# Patient Record
Sex: Female | Born: 1935 | Race: White | Hispanic: No | Smoking: Never smoker
Health system: Southern US, Community
[De-identification: ages and names within clinical notes are randomized; demographics above are authoritative.]

## PROBLEM LIST (undated history)

## (undated) DIAGNOSIS — Z5189 Encounter for other specified aftercare: Secondary | ICD-10-CM

## (undated) DIAGNOSIS — M1990 Unspecified osteoarthritis, unspecified site: Secondary | ICD-10-CM

## (undated) DIAGNOSIS — R51 Headache: Secondary | ICD-10-CM

## (undated) DIAGNOSIS — I10 Essential (primary) hypertension: Secondary | ICD-10-CM

## (undated) DIAGNOSIS — R519 Headache, unspecified: Secondary | ICD-10-CM

## (undated) DIAGNOSIS — K5900 Constipation, unspecified: Secondary | ICD-10-CM

## (undated) DIAGNOSIS — K219 Gastro-esophageal reflux disease without esophagitis: Secondary | ICD-10-CM

## (undated) DIAGNOSIS — M810 Age-related osteoporosis without current pathological fracture: Secondary | ICD-10-CM

## (undated) DIAGNOSIS — F419 Anxiety disorder, unspecified: Secondary | ICD-10-CM

## (undated) DIAGNOSIS — F32A Depression, unspecified: Secondary | ICD-10-CM

## (undated) DIAGNOSIS — F329 Major depressive disorder, single episode, unspecified: Secondary | ICD-10-CM

## (undated) HISTORY — PX: CHOLECYSTECTOMY: SHX55

## (undated) HISTORY — PX: ABDOMINAL HYSTERECTOMY: SHX81

## (undated) HISTORY — PX: COLON SURGERY: SHX602

---

## 1999-05-02 ENCOUNTER — Encounter: Admission: RE | Admit: 1999-05-02 | Discharge: 1999-05-02 | Payer: Self-pay | Admitting: *Deleted

## 1999-05-02 ENCOUNTER — Encounter: Payer: Self-pay | Admitting: *Deleted

## 2001-09-28 ENCOUNTER — Inpatient Hospital Stay (HOSPITAL_COMMUNITY): Admission: EM | Admit: 2001-09-28 | Discharge: 2001-09-29 | Payer: Self-pay | Admitting: Internal Medicine

## 2001-09-28 ENCOUNTER — Encounter: Payer: Self-pay | Admitting: Internal Medicine

## 2007-05-29 ENCOUNTER — Inpatient Hospital Stay (HOSPITAL_COMMUNITY): Admission: EM | Admit: 2007-05-29 | Discharge: 2007-06-01 | Payer: Self-pay | Admitting: Emergency Medicine

## 2008-01-20 ENCOUNTER — Encounter: Admission: RE | Admit: 2008-01-20 | Discharge: 2008-01-20 | Payer: Self-pay | Admitting: Specialist

## 2008-02-10 ENCOUNTER — Encounter: Admission: RE | Admit: 2008-02-10 | Discharge: 2008-02-10 | Payer: Self-pay | Admitting: Specialist

## 2010-07-31 ENCOUNTER — Encounter: Payer: Self-pay | Admitting: Specialist

## 2010-11-21 NOTE — H&P (Signed)
NAME:  Carolyn Odonnell, Carolyn Odonnell                 ACCOUNT NO.:  [id number]   MEDICAL RECORD NO.:  [id number]           PATIENT TYPE:   LOCATION:                               FACILITY:  [facility]   PHYSICIAN:  Hollice Espy, M.D.DATE OF BIRTH:  [birth year]   DATE OF ADMISSION:  05/29/2007  DATE OF DISCHARGE:                              HISTORY & PHYSICAL   PRIMARY CARE PHYSICIAN:  Deirdre Peer. Polite, M.D.   CHIEF COMPLAINT:  Syncope.   HISTORY OF PRESENT ILLNESS:  The patient is an 77 year old white female  with a past medical history of GERD, arthritis, and partial colonic  resection secondary to rupture who has been in relatively good health,  although she suffers from constipation.  She has noted no shortness of  breath, no cough, no dysuria, no diarrhea in the last few weeks but then  for the last several days she has been complaining of dizziness when she  stands and she got to the point where she felt so lightheaded today in  the bathroom that she passed out.  She said that she noted no diarrhea  or blood in her stool prior to passing out.  When she came to, she was  brought into the emergency room.  She was noted on admission to have a  heart rate of 51, a blood pressure which was stable around 116/51.  The  rest of her labs were essentially unremarkable.  There blood pressures  are all laying down.  The patient had lab work done including a CT scan  of the head which was unremarkable.  A chest x-ray showed no evidence of  any acute infarct.  However, her lab work noted a white count of 33 with  a 92% shift.  Her MCV was on the high end of normal at 99.  The rest of  her labs were noted for a sodium of 130, BUN of 20, creatinine of 1.2,  and minimally elevated transaminases with an AST of 68, ALT of 40, and  an alk phos of 145.  As mentioned before, chest x-ray and UA were  unremarkable.  The patient had no interventions done in the emergency  room.   REVIEW OF SYSTEMS:  Currently she  states she is feeling okay.  She  denies any headache and no dizziness as long as she lies down.  No  vision changes.  No dysphagia.  No chest pain, palpitations.  No  shortness of breath, wheezing, coughing.  She notes no abdominal pain,  although she again has occasional problems with constipation and some  abdominal fullness.  She last had a bowel movement several days ago but  says it was incomplete.  She notes no diarrhea.  No focal extremity  numbness, weakness, or pain.  Review of systems is otherwise negative.   PAST MEDICAL HISTORY:  1. Arthritis.  2. GERD.  3. Occasional vertigo.  4. Hypertension.   MEDICATIONS:  She is on Aciphex, Norvasc, atenolol, Darvocet, meclizine  p.r.n., meloxicam, Prilosec, Paxil, Premarin, and Ambien.   ALLERGIES:  She has no known drug allergies.  SOCIAL HISTORY:  She denies any tobacco, alcohol, or drug use.   FAMILY HISTORY:  Noncontributory.   PHYSICAL EXAMINATION:  VITAL SIGNS:  On admission temp 97.1; heart rate  51 initially up to 62; blood pressure 116/51 initially up to as high as  140/66; respirations 20, O2 sat 97% on room air.  GENERAL:  She is alert and oriented x3.  No apparent distress.  HEENT:  Normocephalic atraumatic.  Her mucous membranes are slightly  dry.  NECK:  She has no carotid bruits.  HEART:  Regular rate and rhythm.  S1 S2.  LUNGS:  Clear to auscultation bilaterally.  ABDOMEN:  Soft and nontender.  Hyperactive bowel sounds.  EXTREMITIES:  Show no clubbing, cyanosis, or edema.   LABORATORY:  White count is 33, MCV is 99, hemoglobin and hematocrit  13.5 and 40, platelet count 424, 92% neutrophils.  INR is 1.  UA showed  a small amount of bilirubin, otherwise unremarkable.  Sodium 130,  potassium 3.4, chloride 96, bicarb 25, BUN 20, creatinine 1.3, glucose  119.  LFTs are noted for AST 68, ALT 40, alk phos 147.  CT scan is  unremarkable.  Chest x-ray shows some left upper lobe scarring.  EKG  shows a normal sinus  rhythm, low voltage QRS.   ASSESSMENT/PLAN:  1. Syncope.  Likely cause, she may be orthostatic from possible      dehydration from infection.  We will plan to gently hydrate and      recheck labs in the morning and ambulate in the morning.  2. Leukocytosis, markedly elevated.  With this degree of elevation and      the patient is not on any steroids, I suspect that this likely is      an infection.  We have ruled out urine, pulmonary, or C. diff and I      am concerned about the possibility with her chronic constipation a      possible bacterial __________ of the gut from stool.  Cipro and      Flagyl and check an abdominal x-ray to confirm.  Recheck CBC      tomorrow.  3. Acute renal insufficiency likely secondary to number one.  We will      plan to hydrate and recheck labs.  4. Gastroesophageal reflux disease.  Continue Protonix.      Hollice Espy, M.D.  Electronically Signed     SKK/MEDQ  D:  05/29/2007  T:  05/29/2007  Job:  [id number]   cc:   Deirdre Peer. Polite, M.D.

## 2010-11-21 NOTE — Discharge Summary (Signed)
Carolyn Odonnell, Carolyn Odonnell                 ACCOUNT NO.:  [id number]   MEDICAL RECORD NO.:  [id number]          PATIENT TYPE:  INP   LOCATION:  4730                         FACILITY:  [facility]   PHYSICIAN:  Georgann Housekeeper, MD      DATE OF BIRTH:  1919/11/01   DATE OF ADMISSION:  05/29/2007  DATE OF DISCHARGE:  06/01/2007                               DISCHARGE SUMMARY   DISCHARGE DIAGNOSES:  1. Syncope, probably related to vasovagal or micturition.  No evidence      of any bradycardia during the monitor on hospitalization.  Blood      pressure remained stable.  2. Elevated white count, mild dehydration.  No evidence of infection.      Blood culture and urine culture remained negative.  3. Constipation.  4. Hypertension.   MEDICATIONS ON DISCHARGE:  1. Fiorinal 50/325 two capsules daily.  2. Premarin 0.9 mg daily.  3. Lotrel 10/20 one a day.  4. Atenolol 5 mg daily.  5. Paxil 10 mg daily.  6. Prilosec 20 mg daily.  7. Ambien 5 mg q.h.s.  8. Mobic 7.5 mg daily.  9. Darvocet 2 tablets as needed.  10.Vitamin D 2000 units daily.  11.Multivitamin 1 a day.  12.MiraLax 17 grams daily.  13.Colace 100 mg b.i.d.   Follow with Dr. Nehemiah Settle in 2 weeks.   LABORATORY DATA:  White count 8.6, hemoglobin 11.2.  Sodium 136,  potassium 3.7, creatinine 0.8.  LFTs normal.  Blood culture, urine  culture is negative.  Chest x-ray negative.  Head CT was negative.   HOSPITAL COURSE:  An 77 year old with above medical conditions who  presented with a presyncopal episode while she was in the bathroom.  In  the emergency room, she was found to have an elevated white count of  32,000.  She was started on antibiotic Flagyl and Cipro empirically.  Blood culture and urine culture all remained negative.  Her white count  the next day came back to be normal.  Since the cultures were negative,  the antibiotics were stopped.  She was mildly dehydrated, was started on  IV fluids.  Creatinine was 1.29 and came down to 0.8.   Also, she had  mildly elevated LFTs which also resolved.  KUB showed some constipation.  The patient had had long problems with constipation.  No evidence of any  acute abdomen or obstruction.  The patient was started MiraLax and stool  softener.  She required some Fleets enema and Dulcolax.  She will  maintain the bowel regimen and follow outpatient, and adjust it  appropriately.   As far as hypertension, blood pressure remained stable.  Her heart rate  remained in the 60s and 70s on monitor.  No evidence of any symptomatic  bradycardia.   As far as her follow up, in 2 weeks with Dr. Nehemiah Settle at the office.      Georgann Housekeeper, MD  Electronically Signed     KH/MEDQ  D:  06/01/2007  T:  06/01/2007  Job:  [id number]

## 2010-11-24 NOTE — H&P (Signed)
[place]  Patient:    Carolyn Odonnell, Carolyn Odonnell Visit Number: [id number] MRN: [id number]          Service Type: MED Location: [id number] Attending Physician:  Kae Heller Dictated by:   _________________ Admit Date:  09/28/2001 Discharge Date: 09/29/2001                           History and Physical  DATE OF BIRTH:  04-05-1920  CHIEF COMPLAINT:  Lower abdominal pain, nausea, vomiting and now diarrhea.  HISTORY OF PRESENT ILLNESS:  This is an 77 year old female with a history of hypertension, GERD, recent diagnosis of iron deficiency anemia and distant history of diverticulitis with partial colectomy, who was seen at the [facility] this morning with nausea, vomiting and diarrhea.  Patient complains of developing bilateral lower quadrant pain last evening around 11 p.m., followed by multiple episodes of nausea and vomiting. No hematemesis.  Phenergan and hyoscyamine were eventually called in by Dr. Lester Kinsman, who was on call, and those medications helped her briefly, but her symptoms recurred later in the early morning hours and she presented to the walk-in clinic around 11 a.m.  Patient states she was at that time keeping some clear liquids down.  Patient also states that she was feeling quite bloated and her abdomen seemed quite distended this morning. She felt like she needed to have a bowel movement but could not.  Abdominal films did not support small-bowel obstruction at walk-in clinic but an ileus. White blood cell count was 18.1 with 82% neutrophils.  Hemoglobin measures 9.6, platelets 553,000, MCV 74.6.  Urinalysis was normal.  Patient was given a Phenergan suppository and subsequently developed loose stools now x5.  No melena.  No hematochezia.  Patient states she feels much better since she has had multiple stools.  She has not had a fever that she is aware of.  Patient does have a  history in 1991 of developing a perforated diverticula apparently with abscess formation and underwent a partial colectomy with removal of around 13 cm of colon.  Patient states that one week ago she was seen by Dr. Barbette Hair. Vaughan Basta. for a sense of fatigue and her stool was noted to be heme-positive on rectal exam and a hemoglobin returned at 9.0.  She was started on iron and referred to Dr. Genene Churn. Sherin Quarry for evaluation; that evaluation is pending.  States her last -- what sounds like a -- flexible sigmoidoscopy was around two years ago with Dr. Hedwig Morton. Juanda Chance and apparently was normal.  Patient denies any history of peptic ulcer disease, just GERD for which she takes Prilosec regularly.  She denies nonsteroidal anti-inflammatory drug use, although she does use Fiorinal intermittently.  PAST MEDICAL HISTORY: 1. Hypertension. 2. GERD. 3. Question of depression. 4. Neck pain/headache, chronic. 5. Arthritis.  PAST SURGICAL HISTORY: 1. Partial colectomy as noted previously with diverticulitis, 1991. 2. Status post appendectomy. 3. Status post cholecystectomy, 1970. 4. Status post hysterectomy. 5. Status post bilateral salpingo-oophorectomy.  MEDICATIONS: 1. Norvasc, unknown dose, suspect 5 mg daily. 2. Atenolol, unknown dose, suspect 50 mg one-half tab p.o. q.d. 3. Hydrochlorothiazide, unknown dose, suspect 50 mg one-half tab p.o. q.d. 4. Prilosec, unknown dose, suspect 20 mg p.o. q.d. 5. Paxil, unknown dose, suspect 10 mg p.o. q.d. 6. Ambien -- suspect 10 mg a half tab p.o. q.h.s. 7. Fiorinal p.r.n. neck pain and  headache.  ALLERGIES:  PENICILLIN causes swelling; SULFA -- unknown reaction; TETRACYCLINE -- visual changes; NOVOCAIN -- palpitations; DEMEROL -- unknown reaction; PERCOCET WITH CODEINE -- hot flushing; IVP DYE -- unknown reaction.  SOCIAL HISTORY:  Tobacco:  No use.  Alcohol:  No use.  Caffeine:  Only in her Fiorinal.  FAMILY HISTORY:  Father died at age  56, heart disease and diverticulitis. Mother died at age 88, diabetes and heart disease.  Sister, age 59, with heart disease and diabetes.  Two brothers, one died at age 73 of an MI and another died at age 31 of unknown type of lymphoma.  Son, age 27, is healthy.  SOCIAL HISTORY:  Patient lives with her husband of 56 years.  REVIEW OF SYSTEMS:  Has had recent sores on the roof of her mouth, treated with what sounds like Magik Mouthwash.  PULMONARY:  Denies shortness of breath, cough, respiratory symptoms recently.  CARDIOVASCULAR:  Other than hypertension, denies heart disease.  PHYSICAL EXAMINATION:  VITAL SIGNS:  Temperature 98.3, heart rate 80 and regular, respiratory rate 20, blood pressure 154/78.  Weight 139.  HEENT:  Patient with a benign-appearing overgrowth of the roof of her mouth with central superficial irritation.  Mucous membranes are moist.  Throat without injection.  Tympanic membranes are clear.  Pupils are equal, round and reactive to light.  Extraocular movements intact.  NECK:  Supple without adenopathy.  No thyromegaly.  CHEST:  Clear.  CARDIOVASCULAR:  Regular rate and rhythm with normal S1 and S2.  No S3 or S4. She does have a grade 2/6 systolic murmur at left sternal border, does not radiate.  No carotid bruits.  ______ femoral, DP and PT pulses normodynamic and equal.  ABDOMEN:  Soft.  Bowel sounds present throughout.  Mild bilateral lower quadrant tenderness.  No rebound.  No peritoneal signs.  EXTREMITIES:  Without edema.  RECTAL:  Deferred as she had a recent heme-positive stool and would not necessarily expect that to be changed.  ASSESSMENT AND PLAN: 1. Abdominal pain with nausea and vomiting and now diarrhea.  It seems    improved at this point, however, with her elevated white count, we will    check a CT scan of the abdomen and pelvis; hopefully, we can get that    tonight.  If note, I will go ahead and start intravenous antibiotics.  If     it is negative, we will just supply her with intravenous fluids and Tylenol     as needed.  We will allow her to have water p.o., Phenergan intravenously    p.r.n., to hold her other p.o. medications for now. 2. Hypertension, fair control at this point.  We will hold her medications for    now. 3. Gastroesophageal reflux disease.  Protonix intravenously. Dictated by:   _________________ Attending Physician:  Kae Heller DD:  09/28/01 TD:  09/29/01 Job: [id number]

## 2011-04-17 LAB — COMPREHENSIVE METABOLIC PANEL
ALT: 38 — ABNORMAL HIGH
ALT: 62 — ABNORMAL HIGH
Albumin: 3.2 — ABNORMAL LOW
Albumin: 3.9
Alkaline Phosphatase: 147 — ABNORMAL HIGH
Alkaline Phosphatase: 97
BUN: 11
BUN: 20
BUN: 8
CO2: 25
CO2: 25
CO2: 25
Calcium: 9.6
Chloride: 105
Chloride: 96
Creatinine, Ser: 0.87
Creatinine, Ser: 1.29 — ABNORMAL HIGH
GFR calc non Af Amer: >60
GFR calc non Af Amer: >60
Glucose, Bld: 119 — ABNORMAL HIGH
Glucose, Bld: 80
Potassium: 3.7
Total Bilirubin: 0.7
Total Protein: 5.5 — ABNORMAL LOW
Total Protein: 6.7

## 2011-04-17 LAB — PROTIME-INR: INR: 1

## 2011-04-17 LAB — CBC
HCT: 32.2 — ABNORMAL LOW
HCT: 34.7 — ABNORMAL LOW
HCT: 39.5
Hemoglobin: 11.2 — ABNORMAL LOW
Hemoglobin: 12
Hemoglobin: 13.5
MCHC: 34.6
MCV: 98.9
MCV: 99.1
MCV: 99.1
Platelets: 320
RBC: 3.26 — ABNORMAL LOW
RBC: 3.5 — ABNORMAL LOW
RBC: 3.99
RDW: 14.8
WBC: 32.9 — ABNORMAL HIGH
WBC: 8.6

## 2011-04-17 LAB — URINE CULTURE: Culture: NO GROWTH

## 2011-04-17 LAB — DIFFERENTIAL
Basophils Relative: 0
Eosinophils Absolute: 0.1 — ABNORMAL LOW
Lymphs Abs: 1.2
Monocytes Absolute: 1.3 — ABNORMAL HIGH
Monocytes Relative: 4

## 2011-04-17 LAB — URINALYSIS, ROUTINE W REFLEX MICROSCOPIC
Glucose, UA: NEGATIVE
Ketones, ur: NEGATIVE
Nitrite: NEGATIVE
Protein, ur: NEGATIVE

## 2011-04-17 LAB — APTT: aPTT: 25

## 2011-04-17 LAB — SAMPLE TO BLOOD BANK

## 2011-12-23 ENCOUNTER — Emergency Department (HOSPITAL_COMMUNITY): Payer: Medicare Other

## 2011-12-23 ENCOUNTER — Inpatient Hospital Stay (HOSPITAL_COMMUNITY)
Admission: EM | Admit: 2011-12-23 | Discharge: 2011-12-27 | DRG: 392 | Disposition: A | Discharge status: Nursing Home (Includes ICF) | Payer: Medicare Other | Source: Ambulatory Visit | Attending: Internal Medicine | Admitting: Internal Medicine

## 2011-12-23 ENCOUNTER — Encounter (HOSPITAL_COMMUNITY): Payer: Self-pay

## 2011-12-23 DIAGNOSIS — M129 Arthropathy, unspecified: Secondary | ICD-10-CM | POA: Diagnosis present

## 2011-12-23 DIAGNOSIS — F3289 Other specified depressive episodes: Secondary | ICD-10-CM | POA: Diagnosis present

## 2011-12-23 DIAGNOSIS — D7289 Other specified disorders of white blood cells: Secondary | ICD-10-CM

## 2011-12-23 DIAGNOSIS — R55 Syncope and collapse: Secondary | ICD-10-CM | POA: Diagnosis present

## 2011-12-23 DIAGNOSIS — M8448XA Pathological fracture, other site, initial encounter for fracture: Secondary | ICD-10-CM | POA: Diagnosis present

## 2011-12-23 DIAGNOSIS — Z23 Encounter for immunization: Secondary | ICD-10-CM

## 2011-12-23 DIAGNOSIS — G43909 Migraine, unspecified, not intractable, without status migrainosus: Secondary | ICD-10-CM | POA: Diagnosis present

## 2011-12-23 DIAGNOSIS — F411 Generalized anxiety disorder: Secondary | ICD-10-CM | POA: Diagnosis present

## 2011-12-23 DIAGNOSIS — D72829 Elevated white blood cell count, unspecified: Secondary | ICD-10-CM | POA: Diagnosis present

## 2011-12-23 DIAGNOSIS — S22000A Wedge compression fracture of unspecified thoracic vertebra, initial encounter for closed fracture: Secondary | ICD-10-CM

## 2011-12-23 DIAGNOSIS — I10 Essential (primary) hypertension: Secondary | ICD-10-CM

## 2011-12-23 DIAGNOSIS — S22009A Unspecified fracture of unspecified thoracic vertebra, initial encounter for closed fracture: Secondary | ICD-10-CM

## 2011-12-23 DIAGNOSIS — F329 Major depressive disorder, single episode, unspecified: Secondary | ICD-10-CM | POA: Diagnosis present

## 2011-12-23 DIAGNOSIS — K5289 Other specified noninfective gastroenteritis and colitis: Principal | ICD-10-CM | POA: Diagnosis present

## 2011-12-23 DIAGNOSIS — G4700 Insomnia, unspecified: Secondary | ICD-10-CM | POA: Diagnosis present

## 2011-12-23 HISTORY — DX: Anxiety disorder, unspecified: F41.9

## 2011-12-23 HISTORY — DX: Essential (primary) hypertension: I10

## 2011-12-23 HISTORY — DX: Unspecified osteoarthritis, unspecified site: M19.90

## 2011-12-23 HISTORY — DX: Depression, unspecified: F32.A

## 2011-12-23 HISTORY — DX: Major depressive disorder, single episode, unspecified: F32.9

## 2011-12-23 LAB — CBC
Hemoglobin: 12.8 g/dL (ref 12.0–15.0)
MCH: 33.3 pg (ref 26.0–34.0)
Platelets: 206 10*3/uL (ref 150–400)
RBC: 3.84 MIL/uL — ABNORMAL LOW (ref 3.87–5.11)
WBC: 20.2 10*3/uL — ABNORMAL HIGH (ref 4.0–10.5)

## 2011-12-23 LAB — COMPREHENSIVE METABOLIC PANEL
ALT: 13 U/L (ref 0–35)
AST: 28 U/L (ref 0–37)
Calcium: 10.3 mg/dL (ref 8.4–10.5)
Creatinine, Ser: 0.89 mg/dL (ref 0.50–1.10)
GFR calc Af Amer: 64 mL/min — ABNORMAL LOW (ref >90)
Sodium: 132 mEq/L — ABNORMAL LOW (ref 135–145)
Total Protein: 6.7 g/dL (ref 6.0–8.3)

## 2011-12-23 LAB — DIFFERENTIAL
Eosinophils Absolute: 0 10*3/uL (ref 0.0–0.7)
Lymphocytes Relative: 6 % — ABNORMAL LOW (ref 12–46)
Lymphs Abs: 1.1 10*3/uL (ref 0.7–4.0)
Monocytes Relative: 8 % (ref 3–12)
Neutrophils Relative %: 86 % — ABNORMAL HIGH (ref 43–77)

## 2011-12-23 LAB — TROPONIN I: Troponin I: <0.3 ng/mL (ref <0.30)

## 2011-12-23 LAB — URINALYSIS, ROUTINE W REFLEX MICROSCOPIC
Hgb urine dipstick: NEGATIVE
Nitrite: NEGATIVE
Specific Gravity, Urine: 1.014 (ref 1.005–1.030)
Urobilinogen, UA: 1 mg/dL (ref 0.0–1.0)
pH: 5 (ref 5.0–8.0)

## 2011-12-23 LAB — LACTIC ACID, PLASMA: Lactic Acid, Venous: 1.9 mmol/L (ref 0.5–2.2)

## 2011-12-23 MED ORDER — ACETAMINOPHEN 325 MG PO TABS
650.0000 mg | ORAL_TABLET | Freq: Four times a day (QID) | ORAL | Status: DC | PRN
  Administered 2011-12-25 – 2011-12-26 (×2): 650 mg via ORAL
  Filled 2011-12-23: qty 1
  Filled 2011-12-23: qty 2

## 2011-12-23 MED ORDER — AMLODIPINE BESY-BENAZEPRIL HCL 10-40 MG PO CAPS: 1.0000 | ORAL_CAPSULE | Freq: Every morning | ORAL | Status: DC

## 2011-12-23 MED ORDER — ONDANSETRON HCL 4 MG PO TABS: 4.0000 mg | ORAL_TABLET | Freq: Four times a day (QID) | ORAL | Status: DC | PRN

## 2011-12-23 MED ORDER — BENAZEPRIL HCL 40 MG PO TABS
40.0000 mg | ORAL_TABLET | Freq: Every day | ORAL | Status: DC
  Administered 2011-12-24 – 2011-12-27 (×4): 40 mg via ORAL
  Filled 2011-12-23 (×4): qty 1

## 2011-12-23 MED ORDER — OXYBUTYNIN CHLORIDE ER 5 MG PO TB24
5.0000 mg | ORAL_TABLET | Freq: Every morning | ORAL | Status: DC
Start: 2011-12-24 — End: 2011-12-27
  Administered 2011-12-24 – 2011-12-27 (×4): 5 mg via ORAL
  Filled 2011-12-23 (×4): qty 1

## 2011-12-23 MED ORDER — SODIUM CHLORIDE 0.9 % IV SOLN
Freq: Once | INTRAVENOUS | Status: AC
  Administered 2011-12-23: 17:00:00 via INTRAVENOUS

## 2011-12-23 MED ORDER — PAROXETINE HCL 10 MG PO TABS
10.0000 mg | ORAL_TABLET | Freq: Every morning | ORAL | Status: DC
  Administered 2011-12-24 – 2011-12-27 (×4): 10 mg via ORAL
  Filled 2011-12-23 (×4): qty 1

## 2011-12-23 MED ORDER — SODIUM CHLORIDE 0.9 % IV SOLN
INTRAVENOUS | Status: AC
  Administered 2011-12-23: 75 mL/h via INTRAVENOUS

## 2011-12-23 MED ORDER — PANTOPRAZOLE SODIUM 40 MG PO TBEC
40.0000 mg | DELAYED_RELEASE_TABLET | Freq: Every day | ORAL | Status: DC
  Administered 2011-12-24 – 2011-12-27 (×4): 40 mg via ORAL
  Filled 2011-12-23: qty 1
  Filled 2011-12-23: qty 2
  Filled 2011-12-23 (×2): qty 1

## 2011-12-23 MED ORDER — AMLODIPINE BESYLATE 10 MG PO TABS
10.0000 mg | ORAL_TABLET | Freq: Every day | ORAL | Status: DC
  Administered 2011-12-24 – 2011-12-27 (×4): 10 mg via ORAL
  Filled 2011-12-23 (×4): qty 1

## 2011-12-23 MED ORDER — SODIUM CHLORIDE 0.9 % IJ SOLN
3.0000 mL | Freq: Two times a day (BID) | INTRAMUSCULAR | Status: DC
  Administered 2011-12-26: 3 mL via INTRAVENOUS

## 2011-12-23 MED ORDER — CIPROFLOXACIN IN D5W 200 MG/100ML IV SOLN
200.0000 mg | INTRAVENOUS | Status: DC
Start: 2011-12-24 — End: 2011-12-26
  Administered 2011-12-24 – 2011-12-26 (×3): 200 mg via INTRAVENOUS
  Filled 2011-12-23 (×3): qty 100

## 2011-12-23 MED ORDER — FENTANYL CITRATE 0.05 MG/ML IJ SOLN
12.5000 ug | Freq: Once | INTRAMUSCULAR | Status: AC
  Administered 2011-12-23: 12.5 ug via INTRAVENOUS
  Filled 2011-12-23: qty 2

## 2011-12-23 MED ORDER — ONDANSETRON HCL 4 MG/2ML IJ SOLN: 4.0000 mg | Freq: Four times a day (QID) | INTRAMUSCULAR | Status: DC | PRN

## 2011-12-23 MED ORDER — ENSURE PLUS PO LIQD
237.0000 mL | Freq: Three times a day (TID) | ORAL | Status: DC
  Administered 2011-12-24 (×2): 237 mL via ORAL
  Filled 2011-12-23 (×6): qty 237

## 2011-12-23 MED ORDER — SODIUM CHLORIDE 0.9 % IV SOLN
1000.0000 mL | INTRAVENOUS | Status: DC
  Administered 2011-12-23: 1000 mL via INTRAVENOUS

## 2011-12-23 MED ORDER — LIDOCAINE 5 % EX PTCH
1.0000 | MEDICATED_PATCH | CUTANEOUS | Status: DC
Start: 2011-12-24 — End: 2011-12-27
  Administered 2011-12-24 – 2011-12-27 (×4): 1 via TRANSDERMAL
  Filled 2011-12-23 (×5): qty 1

## 2011-12-23 MED ORDER — ONDANSETRON HCL 4 MG/2ML IJ SOLN
INTRAMUSCULAR | Status: AC
  Administered 2011-12-23: 13:00:00
  Filled 2011-12-23: qty 2

## 2011-12-23 MED ORDER — SODIUM CHLORIDE 0.9 % IV SOLN
INTRAVENOUS | Status: AC
  Administered 2011-12-24 (×2): via INTRAVENOUS

## 2011-12-23 MED ORDER — ACETAMINOPHEN 650 MG RE SUPP: 650.0000 mg | Freq: Four times a day (QID) | RECTAL | Status: DC | PRN

## 2011-12-23 NOTE — ED Notes (Signed)
Pt resting quietly at the time. Denies pain. She is alert and oriented x4. Updated on plan of care. Family at bedside. Vital signs stable.

## 2011-12-23 NOTE — ED Notes (Signed)
Pt returned to exam room from CT scan. Vital signs stable. Placed back on cardiac monitor. Denies pain at present. She is alert and oriented x4.

## 2011-12-23 NOTE — ED Notes (Signed)
Admitting MD at bedside.

## 2011-12-23 NOTE — ED Notes (Signed)
Per EMS, pt. C/o N/V/D since 10 AM. Reports that she got up to use the restroom and had a syncopal episode. Denies injuries or pain. Initial BP 110/44 and HR 48 supine, BP 84/40 and HR 60 in fowlers. Given 500 ml NS bolus and 4mg  zofran.

## 2011-12-23 NOTE — ED Notes (Signed)
Pt. States that she was on her way to the bathroom when she "got weak and fell".  States that she didn't hit anything when she fell, denies pain. Reports not feeling well for the past couple days.

## 2011-12-23 NOTE — ED Notes (Addendum)
Pt transported to CT ?

## 2011-12-23 NOTE — ED Notes (Signed)
PT has questions about medication. PT requesting a sleeping aid

## 2011-12-23 NOTE — H&P (Signed)
Carolyn Odonnell is an 78 y.o. female.   PCP - Dr.Polite. Chief Complaint: Loss of consciousness. HPI: 78 year-old female with history of hypertension, arthritis who lives in assisted living facility had an episode where she lost consciousness after she had multiple episodes of nausea and vomiting. Patient states that she lost consciousness for a few minutes. She threw multiple times before that and in the ER patient also is complaining of some right lower quadrant pain. Patient also is complaining of upper back pain. She did not have a tongue bites or incontinence of urine. And was nonfocal. Patient's son states that she has been having dysuria for some time now. Denies any diarrhea. Patient did not have any palpitations chest pain or shortness of breath. In the ER patient had a CT head which did not show anything acute. X-ray of the T-spine done because of the pain in the upper back shows fractures. And also her blood work shows significant leukocytosis. By the time I examined the patient patient denies any abdominal pain but did receive pain relief medication prior to my exam. Patient's son states patient's PCP at this time has been trying to decrease her Ambien and Fioricet medication due to her getting increasingly drowsy. EKG shows sinus bradycardia at rate around 54 per minute. Patient has been admitted for further observation and management.  Past Medical History  Diagnosis Date  . Hypertension   . Arthritis   . Depression   . Anxiety     Past Surgical History  Procedure Date  . Colon surgery   . Abdominal hysterectomy   . Cholecystectomy     History reviewed. No pertinent family history. Social History:  reports that she has never smoked. She does not have any smokeless tobacco history on file. She reports that she does not drink alcohol or use illicit drugs.  Allergies:  Allergies  Allergen Reactions  . Codeine   . Doxycycline   . Fosamax (Alendronate Sodium)   . Morphine And  Related   . Novocain (Procaine Hcl)   . Penicillins   . Percocet (Oxycodone-Acetaminophen)   . Skelaxin (Metaxalone)   . Sulfa Antibiotics Rash     (Not in a hospital admission)  Results for orders placed during the hospital encounter of 12/23/11 (from the past 48 hour(s))  COMPREHENSIVE METABOLIC PANEL     Status: Abnormal   Collection Time   12/23/11  3:34 PM      Component Value Range Comment   Sodium 132 (*) 135 - 145 mEq/L    Potassium 4.0  3.5 - 5.1 mEq/L    Chloride 98  96 - 112 mEq/L    CO2 22  19 - 32 mEq/L    Glucose, Bld 152 (*) 70 - 99 mg/dL    BUN 18  6 - 23 mg/dL    Creatinine, Ser 1.61  0.50 - 1.10 mg/dL    Calcium 09.6  8.4 - 10.5 mg/dL    Total Protein 6.7  6.0 - 8.3 g/dL    Albumin 3.5  3.5 - 5.2 g/dL    AST 28  0 - 37 U/L    ALT 13  0 - 35 U/L    Alkaline Phosphatase 249 (*) 39 - 117 U/L    Total Bilirubin 0.5  0.3 - 1.2 mg/dL    GFR calc non Af Amer 55 (*) >90 mL/min    GFR calc Af Amer 64 (*) >90 mL/min   CBC     Status: Abnormal  Collection Time   12/23/11  3:34 PM      Component Value Range Comment   WBC 20.2 (*) 4.0 - 10.5 K/uL    RBC 3.84 (*) 3.87 - 5.11 MIL/uL    Hemoglobin 12.8  12.0 - 15.0 g/dL    HCT 56.2  13.0 - 86.5 %    MCV 98.2  78.0 - 100.0 fL    MCH 33.3  26.0 - 34.0 pg    MCHC 34.0  30.0 - 36.0 g/dL    RDW 78.4 (*) 69.6 - 15.5 %    Platelets 206  150 - 400 K/uL   DIFFERENTIAL     Status: Abnormal   Collection Time   12/23/11  3:34 PM      Component Value Range Comment   Neutrophils Relative 86 (*) 43 - 77 %    Neutro Abs 17.4 (*) 1.7 - 7.7 K/uL    Lymphocytes Relative 6 (*) 12 - 46 %    Lymphs Abs 1.1  0.7 - 4.0 K/uL    Monocytes Relative 8  3 - 12 %    Monocytes Absolute 1.6 (*) 0.1 - 1.0 K/uL    Eosinophils Relative 0  0 - 5 %    Eosinophils Absolute 0.0  0.0 - 0.7 K/uL    Basophils Relative 0  0 - 1 %    Basophils Absolute 0.0  0.0 - 0.1 K/uL   LACTIC ACID, PLASMA     Status: Normal   Collection Time   12/23/11  3:34 PM        Component Value Range Comment   Lactic Acid, Venous 1.9  0.5 - 2.2 mmol/L   TROPONIN I     Status: Normal   Collection Time   12/23/11  6:08 PM      Component Value Range Comment   Troponin I <0.30  <0.30 ng/mL   URINALYSIS, ROUTINE W REFLEX MICROSCOPIC     Status: Abnormal   Collection Time   12/23/11  8:28 PM      Component Value Range Comment   Color, Urine AMBER (*) YELLOW BIOCHEMICALS MAY BE AFFECTED BY COLOR   APPearance CLOUDY (*) CLEAR    Specific Gravity, Urine 1.014  1.005 - 1.030    pH 5.0  5.0 - 8.0    Glucose, UA NEGATIVE  NEGATIVE mg/dL    Hgb urine dipstick NEGATIVE  NEGATIVE    Bilirubin Urine SMALL (*) NEGATIVE    Ketones, ur NEGATIVE  NEGATIVE mg/dL    Protein, ur NEGATIVE  NEGATIVE mg/dL    Urobilinogen, UA 1.0  0.0 - 1.0 mg/dL    Nitrite NEGATIVE  NEGATIVE    Leukocytes, UA NEGATIVE  NEGATIVE MICROSCOPIC NOT DONE ON URINES WITH NEGATIVE PROTEIN, BLOOD, LEUKOCYTES, NITRITE, OR GLUCOSE <1000 mg/dL.   Dg Chest 2 View  12/23/2011  *RADIOLOGY REPORT*  Clinical Data: Cough.  There is no pain.  Leukocytosis.  CHEST - 2 VIEW  Comparison: 08/02/2011  Findings: Left upper and lower lobe scarring remains stable.  No evidence of acute infiltrate or edema.  No evidence of pleural effusion.  Heart size is stable and within normal limits.  No mass or lymphadenopathy identified.  IMPRESSION: Stable left lung scarring.  No active disease.  Original Report Authenticated By: Danae Orleans, M.D.   Dg Thoracic Spine W/swimmers  12/23/2011  *RADIOLOGY REPORT*  Clinical Data: Fall.  Upper back pain.  THORACIC SPINE - 2 VIEW + SWIMMERS  Comparison: 08/02/2011 chest radiograph.  Findings: There is superior endplate concave deformity of what appears been a T9 vertebra, compatible with a compression fracture. This is new compared to prior exam.  Additionally, there is at T12 superior endplate compression fracture.  There may be retropulsion at T12. Loss of height is about 50%.  Loss of  height at T9 is less than 25%.  There is a levoconvex curvature of the lumbar spine with the apex at L1-L2.  Mild dextroconvex curvature of the thoracic spine. Swimmer's view demonstrates intact cervicothoracic junction. Severe cervical spondylosis is present from C4-C5 through C7-T1.  IMPRESSION: Interval development of T9 and T12 compression fractures.  MRI is the preferred modality determine the age of the fractures and evaluate the central canal.  Timing of the MRI is dependent on neurologic status.  Original Report Authenticated By: Andreas Newport, M.D.   Ct Head Wo Contrast  12/23/2011  *RADIOLOGY REPORT*  Clinical Data: Became weak and fell.  Hypertension.  CT HEAD WITHOUT CONTRAST  Technique:  Contiguous axial images were obtained from the base of the skull through the vertex without contrast.  Comparison: 05/29/2007.  Findings: No skull fracture.  Nonspecific right frontal 1.8 cm bone lesion is stable.  No intracranial hemorrhage.  Global atrophy without hydrocephalus.  Small vessel disease type changes without CT evidence of large acute infarct.  No intracranial mass lesion detected on this unenhanced exam.  Prominent vascular calcifications.  IMPRESSION: No skull fracture or intracranial hemorrhage.  Small vessel disease type changes without CT evidence of large acute infarct.  Please see above.  Original Report Authenticated By: Fuller Canada, M.D.    Review of Systems  Constitutional: Negative.   HENT: Negative.   Eyes: Negative.   Respiratory: Negative.   Cardiovascular: Negative.   Gastrointestinal: Positive for nausea and vomiting.  Genitourinary: Negative.   Musculoskeletal: Negative.   Skin: Negative.   Neurological: Positive for loss of consciousness.  Endo/Heme/Allergies: Negative.   Psychiatric/Behavioral: Negative.     Blood pressure 122/52, pulse 58, temperature 98.4 F (36.9 C), temperature source Oral, resp. rate 16, SpO2 95.00%. Physical Exam  Constitutional: She  is oriented to person, place, and time. She appears well-developed and well-nourished. No distress.  HENT:  Head: Normocephalic and atraumatic.  Right Ear: External ear normal.  Left Ear: External ear normal.  Nose: Nose normal.  Mouth/Throat: Oropharynx is clear and moist. No oropharyngeal exudate.  Eyes: Conjunctivae are normal. Pupils are equal, round, and reactive to light. Right eye exhibits no discharge. Left eye exhibits no discharge. No scleral icterus.  Neck: Normal range of motion. Neck supple.  Cardiovascular: Normal rate and regular rhythm.   Respiratory: Effort normal and breath sounds normal. No respiratory distress. She has no wheezes. She has no rales.  GI: Soft. Bowel sounds are normal. She exhibits no distension. There is no tenderness. There is no rebound.  Musculoskeletal: Normal range of motion. She exhibits no edema and no tenderness.  Neurological: She is alert and oriented to person, place, and time.       Moves all extremities.  Skin: Skin is warm and dry. She is not diaphoretic.  Psychiatric: Her behavior is normal.     Assessment/Plan #1. Syncope most likely vasovagal - we'll monitor patient on telemetry. As patient has sinus bradycardia we'll hold off Tenormin. Gently hydrate and get PT consult. #2. Leukocytosis - reviewing her chart patient did have a leukocytosis previous admission couple of years ago. But this time it is significantly high. We will get blood cultures  and urine cultures. As patient initially was complaining of abdominal pain in the right lower quadrant and also had multiple episodes of nausea and vomiting we'll get a CT abdomen pelvis. At this time I am placing patient on ciprofloxacin as patient's son was saying that she was complaining of dysuria recently. Follow cultures and CBC with differential closely. #3. T-spine fracture - I have ordered MRI of the T-spine to further study of thoracic spine fractures. Based on this study we'll further  plan. #4. History of hypertension - we will continue Lotrel but hold off Tenormin.  CODE STATUS - full code. Patient is admitted under Dr. Idelle Crouch service.  Eduard Clos. 12/23/2011, 11:28 PM

## 2011-12-23 NOTE — Progress Notes (Signed)
78yo female admitted with syncope and leukocytosis, had been c/o dysuria and RLQ abdominal pain, to begin Cipro for presumed UTI.  Will start Cipro 200mg  IV Q24H for CrCl ~23ml/min.  Vernard Gambles, PharmD, BCPS 12/23/2011 11:54 PM

## 2011-12-23 NOTE — ED Provider Notes (Addendum)
History     CSN: [id number]  Arrival date & time 12/23/11  1314   First MD Initiated Contact with Patient 12/23/11 1501      Chief Complaint  Patient presents with  . Near Syncope    (Consider location/radiation/quality/duration/timing/severity/associated sxs/prior treatment) The history is provided by the nursing home.    78 year old female past medical history of hypertension, arthritis, depression, anxiety, and a syncopal episode 2 years ago presenting with a syncopal episode. Patient has been constipated for 2.5 days, she had a Brown's of emesis this morning nonbilious nonbloody. She had some mild nausea the nausea resolved. She went toward the bathroom for bowel movement. She found herself on the floor. There was no prodrome. She does not know how she ended up on the floor. There were nurses around, and they report no head trauma. However she reports some blood on the back of her head.  Apparently there was loss of consciousness.  She has had a bowel movement since that time here in the emergency department. Right now she is symptom-free with no pain, no weakness, no numbness. She denies any confusion or headache. She denies all prodrome, including warmth, palpitations, diaphoresis. Vital signs are stable on arrival.   Past Medical History  Diagnosis Date  . Hypertension   . Arthritis   . Depression   . Anxiety     Past Surgical History  Procedure Date  . Colon surgery   . Abdominal hysterectomy   . Cholecystectomy     History reviewed. No pertinent family history.  History  Substance Use Topics  . Smoking status: Never Smoker   . Smokeless tobacco: Not on file  . Alcohol Use: No    OB History    Grav Para Term Preterm Abortions TAB SAB Ect Mult Living                  Review of Systems Constitutional: Negative for fever and chills.  HENT: Negative for ear pain, sore throat and trouble swallowing.   Eyes: Negative for pain and visual disturbance.    Respiratory: Negative for cough and shortness of breath.   Cardiovascular: Negative for chest pain and leg swelling.  Gastrointestinal: Negative for nausea, POS vomiting, abdominal pain and diarrhea.  Genitourinary: Negative for dysuria, urgency and frequency.  Musculoskeletal: Negative for back pain and joint swelling.  Skin: Negative for rash and wound.  Neurological: Negative for dizziness, POS syncope, neg speech difficulty, POS generalized weakness and numbness.   Allergies  Codeine; Doxycycline; Fosamax; Morphine and related; Novocain; Penicillins; Percocet; Skelaxin; and Sulfa antibiotics  Home Medications   No current outpatient prescriptions on file.  BP 158/64  Pulse 52  Temp 97.3 F (36.3 C) (Oral)  Resp 16  Ht 5' 2.4" (1.585 m)  Wt 108 lb 0.4 oz (49 kg)  BMI 19.51 kg/m2  SpO2 98%  Physical Exam Consitutional: Pt in no acute distress.   Head: Normocephalic.  Small TTP area posterior skull above occiput.   Eyes: Extraocular motion intact, no scleral icterus Neck: Supple without meningismus, mass, or overt JVD Spine: TTP thoracic spine.  Respiratory: Effort normal and breath sounds normal. No respiratory distress. CV: Heart regular rate and regular rhythm (sinus), no obvious murmurs.  Pulses +2 and symmetric Abdomen: Soft, non-tender, non-distended. No rebound or guarding.  MSK: Extremities are atraumatic without deformity, ROM intact Skin: Warm, dry, intact Neuro: Alert and oriented, no motor deficit noted.  PERRL.  EOMI.  Symmetric arm raises bilaterally, no pronator drift.  Reflexes normal BLE at achilles and patella;  no clonus.  Hips, knee and ankle, arm and wrist strength maintained >=4/5.  FTN and RAM normal.  CNs 2-12 normal.   Babinski normal.  Psychiatric: Mood and affect are normal  EKG:  Rate: 54 Rythym sinu brady  Interval 168  ms. Axis: borderline LAD No gross conduction abnormalities appreciated.  No gross ST or T-wave abnormalities appreciated.   Essentially unchanged.    ED Course  Procedures (including critical care time)  Labs Reviewed  COMPREHENSIVE METABOLIC PANEL - Abnormal; Notable for the following:    Sodium 132 (*)     Glucose, Bld 152 (*)     Alkaline Phosphatase 249 (*)     GFR calc non Af Amer 55 (*)     GFR calc Af Amer 64 (*)     All other components within normal limits  URINALYSIS, ROUTINE W REFLEX MICROSCOPIC - Abnormal; Notable for the following:    Color, Urine AMBER (*)  BIOCHEMICALS MAY BE AFFECTED BY COLOR   APPearance CLOUDY (*)     Bilirubin Urine SMALL (*)     All other components within normal limits  CBC - Abnormal; Notable for the following:    WBC 20.2 (*)     RBC 3.84 (*)     RDW 15.9 (*)     All other components within normal limits  DIFFERENTIAL - Abnormal; Notable for the following:    Neutrophils Relative 86 (*)     Neutro Abs 17.4 (*)     Lymphocytes Relative 6 (*)     Monocytes Absolute 1.6 (*)     All other components within normal limits  LACTIC ACID, PLASMA  TROPONIN I  CULTURE, BLOOD (ROUTINE X 2)  CULTURE, BLOOD (ROUTINE X 2)  URINE CULTURE  COMPREHENSIVE METABOLIC PANEL  CBC  DIFFERENTIAL   Dg Chest 2 View  12/23/2011  *RADIOLOGY REPORT*  Clinical Data: Cough.  There is no pain.  Leukocytosis.  CHEST - 2 VIEW  Comparison: 08/02/2011  Findings: Left upper and lower lobe scarring remains stable.  No evidence of acute infiltrate or edema.  No evidence of pleural effusion.  Heart size is stable and within normal limits.  No mass or lymphadenopathy identified.  IMPRESSION: Stable left lung scarring.  No active disease.  Original Report Authenticated By: Danae Orleans, M.D.   Dg Thoracic Spine W/swimmers  12/23/2011  *RADIOLOGY REPORT*  Clinical Data: Fall.  Upper back pain.  THORACIC SPINE - 2 VIEW + SWIMMERS  Comparison: 08/02/2011 chest radiograph.  Findings: There is superior endplate concave deformity of what appears been a T9 vertebra, compatible with a compression  fracture. This is new compared to prior exam.  Additionally, there is at T12 superior endplate compression fracture.  There may be retropulsion at T12. Loss of height is about 50%.  Loss of height at T9 is less than 25%.  There is a levoconvex curvature of the lumbar spine with the apex at L1-L2.  Mild dextroconvex curvature of the thoracic spine. Swimmer's view demonstrates intact cervicothoracic junction. Severe cervical spondylosis is present from C4-C5 through C7-T1.  IMPRESSION: Interval development of T9 and T12 compression fractures.  MRI is the preferred modality determine the age of the fractures and evaluate the central canal.  Timing of the MRI is dependent on neurologic status.  Original Report Authenticated By: Andreas Newport, M.D.   Ct Head Wo Contrast  12/23/2011  *RADIOLOGY REPORT*  Clinical Data: Became weak and fell.  Hypertension.  CT HEAD WITHOUT CONTRAST  Technique:  Contiguous axial images were obtained from the base of the skull through the vertex without contrast.  Comparison: 05/29/2007.  Findings: No skull fracture.  Nonspecific right frontal 1.8 cm bone lesion is stable.  No intracranial hemorrhage.  Global atrophy without hydrocephalus.  Small vessel disease type changes without CT evidence of large acute infarct.  No intracranial mass lesion detected on this unenhanced exam.  Prominent vascular calcifications.  IMPRESSION: No skull fracture or intracranial hemorrhage.  Small vessel disease type changes without CT evidence of large acute infarct.  Please see above.  Original Report Authenticated By: Fuller Canada, M.D.     Clinical impression: Syncope, Thoracic compression fracture  MDM  Looks very well on exam, crisp mentation and normal vital signs. Syncope with recent vomiting. History of electrolyte disturbance. CT scan head. CT scan abdomen. Blood work.  Plain film T-spine secondary to T-spine tenderness to palpation.          Larrie Kass, MD 12/24/11  0142   I saw and evaluated the patient, reviewed the resident's note and I agree with the findings and pla, ekg interpretation (reviewed by me)  RRR, CTAB. Alert, oriented. States she "Still don't feel right". C/O upper back pain.   Forbes Cellar, MD 12/25/11 1655  Forbes Cellar, MD 12/25/11 [phone]

## 2011-12-23 NOTE — ED Notes (Signed)
Gave ECG to Dr. Hyman Hopes after I performed. 5:47pm JG.

## 2011-12-24 ENCOUNTER — Inpatient Hospital Stay (HOSPITAL_COMMUNITY): Payer: Medicare Other

## 2011-12-24 LAB — COMPREHENSIVE METABOLIC PANEL
ALT: 11 U/L (ref 0–35)
AST: 19 U/L (ref 0–37)
Alkaline Phosphatase: 153 U/L — ABNORMAL HIGH (ref 39–117)
Calcium: 9.4 mg/dL (ref 8.4–10.5)
GFR calc Af Amer: 85 mL/min — ABNORMAL LOW (ref >90)
GFR calc non Af Amer: 73 mL/min — ABNORMAL LOW (ref >90)
Glucose, Bld: 104 mg/dL — ABNORMAL HIGH (ref 70–99)
Potassium: 3.6 mEq/L (ref 3.5–5.1)
Total Bilirubin: 0.5 mg/dL (ref 0.3–1.2)

## 2011-12-24 LAB — DIFFERENTIAL
Basophils Absolute: 0 10*3/uL (ref 0.0–0.1)
Basophils Relative: 0 % (ref 0–1)
Eosinophils Absolute: 0 10*3/uL (ref 0.0–0.7)
Monocytes Relative: 12 % (ref 3–12)
Neutro Abs: 10.6 10*3/uL — ABNORMAL HIGH (ref 1.7–7.7)
Neutrophils Relative %: 73 % (ref 43–77)

## 2011-12-24 LAB — CBC
MCH: 34.4 pg — ABNORMAL HIGH (ref 26.0–34.0)
MCHC: 35.1 g/dL (ref 30.0–36.0)
Platelets: 180 10*3/uL (ref 150–400)
RDW: 16.2 % — ABNORMAL HIGH (ref 11.5–15.5)

## 2011-12-24 MED ORDER — ZOLPIDEM TARTRATE 5 MG PO TABS
5.0000 mg | ORAL_TABLET | Freq: Every evening | ORAL | Status: DC | PRN
  Administered 2011-12-24 – 2011-12-26 (×3): 5 mg via ORAL
  Filled 2011-12-24 (×3): qty 1

## 2011-12-24 MED ORDER — ENSURE COMPLETE PO LIQD
237.0000 mL | Freq: Two times a day (BID) | ORAL | Status: DC
  Administered 2011-12-24 – 2011-12-26 (×5): 237 mL via ORAL

## 2011-12-24 MED ORDER — METRONIDAZOLE IN NACL 5-0.79 MG/ML-% IV SOLN
500.0000 mg | Freq: Three times a day (TID) | INTRAVENOUS | Status: DC
  Administered 2011-12-24 – 2011-12-26 (×6): 500 mg via INTRAVENOUS
  Filled 2011-12-24 (×8): qty 100

## 2011-12-24 MED ORDER — PNEUMOCOCCAL VAC POLYVALENT 25 MCG/0.5ML IJ INJ
0.5000 mL | INJECTION | INTRAMUSCULAR | Status: AC
  Administered 2011-12-25: 0.5 mL via INTRAMUSCULAR
  Filled 2011-12-24: qty 0.5

## 2011-12-24 NOTE — Progress Notes (Signed)
0005 Pt admitted to unit alert and oriented but HOH, left hearing aids at home.  Pt put on telemetry and rhythm is SB.  Skin is dry but intact.  Pt oriented to unit and call bell.  Carolyn Odonnell

## 2011-12-24 NOTE — Clinical Social Work Psychosocial (Addendum)
    Clinical Social Work Department BRIEF PSYCHOSOCIAL ASSESSMENT 12/24/2011  Patient:  Carolyn Odonnell, Carolyn Odonnell     Account Number:  [id number]     Admit date:  12/23/2011  Clinical Social Worker:  Jacelyn Grip  Date/Time:  12/24/2011 03:45 PM  Referred by:  Physician  Date Referred:  12/24/2011 Referred for  ALF Placement   Other Referral:   Interview type:  Patient Other interview type:    PSYCHOSOCIAL DATA Living Status:  FACILITY Admitted from facility:  [facility] Level of care:  Assisted Living Primary support name:  Diane Raffield/daughter/[phone] Primary support relationship to patient:  CHILD, ADULT Degree of support available:   adequate    CURRENT CONCERNS Current Concerns  Post-Acute Placement   Other Concerns:    SOCIAL WORK ASSESSMENT / PLAN CSW met with pt at bedside to discuss disposition needs. Pt confirmed that she is currently a resident at [facility] and plan is to return at discharge. Per PT evaluation, PT feels pt is appropriate to return to ALF with home health services. CSW discussed this with pt and pt is agreeable. CSW contacted [facility] who confirmed that they could accept pt back and that facility has in house PT/OT and would just need orders and FL2 at discharge. CSW notified RNCM. CSW to facilitate pt discharge needs when pt medically stable for discharge.   Assessment/plan status:  Psychosocial Support/Ongoing Assessment of Needs Other assessment/ plan:   discharge planning   Information/referral to community resources:   Referral back to [facility]    PATIENT'S/FAMILY'S RESPONSE TO PLAN OF CARE: Pt alert and oriented and pleasant. Pt reports being pleased with [facility]. Facility supportive and eager to accept pt back when pt medically stable.     Jacklynn Lewis, MSW, LCSWA  Clinical Social Work [phone]

## 2011-12-24 NOTE — Progress Notes (Signed)
INITIAL ADULT NUTRITION ASSESSMENT Date: 12/24/2011   Time: 10:19 AM Reason for Assessment: Nutrition Risk, dysphagia  ASSESSMENT: Female 78 y.o.  Dx: Syncope  Hx:  Past Medical History  Diagnosis Date  . Hypertension   . Arthritis   . Depression   . Anxiety     Related Meds:     . sodium chloride   Intravenous Once  . sodium chloride   Intravenous STAT  . amLODipine  10 mg Oral Daily  . benazepril  40 mg Oral Daily  . ciprofloxacin  200 mg Intravenous Q24H  . Ensure Plus  237 mL Oral TID WC  . fentaNYL  12.5 mcg Intravenous Once  . lidocaine  1 patch Transdermal Q24H  . metronidazole  500 mg Intravenous Q8H  . ondansetron      . oxybutynin  5 mg Oral q morning - 10a  . pantoprazole  40 mg Oral Q1200  . PARoxetine  10 mg Oral q morning - 10a  . pneumococcal 23 valent vaccine  0.5 mL Intramuscular Tomorrow-1000  . sodium chloride  3 mL Intravenous Q12H  . DISCONTD: amLODipine-benazepril  1 capsule Oral q morning - 10a     Ht: 5' 2.4" (158.5 cm)  Wt: 108 lb 0.4 oz (49 kg)  Ideal Wt: 51.02 kg  % Ideal Wt: 96%  Usual Wt: 125-130 lbs per pt report (1/13)  Wt Readings from Last 5 Encounters:  12/23/11 108 lb 0.4 oz (49 kg)    % Usual Wt: 86%  Body mass index is 19.51 kg/(m^2). WNL  Food/Nutrition Related Hx: Pt reports dry mouth. Drinks Ensure BID at nursing facility  Labs:  [facility]     Component Value Date/Time   NA 137 12/24/2011 0540   K 3.6 12/24/2011 0540   CL 106 12/24/2011 0540   CO2 21 12/24/2011 0540   GLUCOSE 104* 12/24/2011 0540   BUN 19 12/24/2011 0540   CREATININE 0.73 12/24/2011 0540   CALCIUM 9.4 12/24/2011 0540   PROT 5.5* 12/24/2011 0540   ALBUMIN 2.7* 12/24/2011 0540   AST 19 12/24/2011 0540   ALT 11 12/24/2011 0540   ALKPHOS 153* 12/24/2011 0540   BILITOT 0.5 12/24/2011 0540   GFRNONAA 73* 12/24/2011 0540   GFRAA 85* 12/24/2011 0540    No intake or output data in the 24 hours ending 12/24/11 1020   Diet Order: Cardiac  Supplements/Tube  Feeding: Ensure Plus TID with meals   IVF:    sodium chloride Last Rate: 100 mL/hr at 12/24/11 0046  DISCONTD: sodium chloride Last Rate: 1,000 mL (12/23/11 1615)    Estimated Nutritional Needs:   Kcal: 1225-1450 Protein: 50-60 gm  Fluid:  > 1.5 L  Pt reports that she lost about 17 lbs (13.6%) after her husbands death in Feb 11, 2013but has been stable at about 109 lbs for a while now. Does drink the Ensure at her facility to help maintain weight, does not like the food provided at the facility.   Pt also c/o dry mouth, typically uses sugar free hard candy to help.   Pt likely has some level of malnutrition given her weight loss over less then 6 months.   NUTRITION DIAGNOSIS: -unintentional weight loss  RELATED TO: recent loss of her husband  AS EVIDENCE BY: 17 lb weight loss in less then 6 months   MONITORING/EVALUATION(Goals): Goal: PO intake to continue to be >75% of most meals Monitor: PO intake, weight, labs, I/O's  EDUCATION NEEDS: -No education needs identified at this  time  INTERVENTION: 1. RD will change Ensure Plus TID to Ensure Complete BID (350 kcal and 13 gm protein per 8 oz bottle) 2. RD will continue to follow  Dietitian [phone]  DOCUMENTATION CODES Per approved criteria  -Not Applicable    Clarene Duke MARIE 12/24/2011, 10:19 AM

## 2011-12-24 NOTE — Progress Notes (Signed)
0127 CT called to come get patient for CT of abdomen per order.  Pt refused CT tonight and would like to go tomorrow instead.  Pt wants to rest.  Rolinda Roan, Dulce Sellar

## 2011-12-24 NOTE — Progress Notes (Signed)
Subjective: Patient pleasant, no nausea no vomiting overnight. Denies abdominal pain however left lower quadrant pain is elicited on exam. She does have a history of diverticulosis. She was admitted with significant leukocytosis, nausea vomiting and complaints of abdominal pain, which is improved after some IV fluids.currently she is on Cipro, IV Flagyl will be added pending CT of the abdomen and pelvis.  Objective: Vital signs in last 24 hours: Temp:  [97.3 F (36.3 C)-98.8 F (37.1 C)] 98.4 F (36.9 C) (06/17 0452) Pulse Rate:  [52-106] 59  (06/17 0452) Resp:  [9-23] 16  (06/17 0452) BP: (116-172)/(46-102) 116/51 mmHg (06/17 0452) SpO2:  [95 %-100 %] 96 % (06/17 0452) Weight:  [49 kg (108 lb 0.4 oz)] 49 kg (108 lb 0.4 oz) (06/16 2346) Weight change:  Last BM Date: 12/23/11  Intake/Output from previous day:   Intake/Output this shift:    Resp: clear to auscultation bilaterally Cardio: regular rate and rhythm, S1, S2 normal, no murmur, click, rub or gallop GI: left lower quadrant tenderness, guarding, no mass appreciated, positive bowel sounds, no organomegaly Extremities: extremities normal, atraumatic, no cyanosis or edema  Lab Results:  Results for orders placed during the hospital encounter of 12/23/11 (from the past 24 hour(s))  COMPREHENSIVE METABOLIC PANEL     Status: Abnormal   Collection Time   12/23/11  3:34 PM      Component Value Range   Sodium 132 (*) 135 - 145 mEq/L   Potassium 4.0  3.5 - 5.1 mEq/L   Chloride 98  96 - 112 mEq/L   CO2 22  19 - 32 mEq/L   Glucose, Bld 152 (*) 70 - 99 mg/dL   BUN 18  6 - 23 mg/dL   Creatinine, Ser 1.61  0.50 - 1.10 mg/dL   Calcium 09.6  8.4 - 04.5 mg/dL   Total Protein 6.7  6.0 - 8.3 g/dL   Albumin 3.5  3.5 - 5.2 g/dL   AST 28  0 - 37 U/L   ALT 13  0 - 35 U/L   Alkaline Phosphatase 249 (*) 39 - 117 U/L   Total Bilirubin 0.5  0.3 - 1.2 mg/dL   GFR calc non Af Amer 55 (*) >90 mL/min   GFR calc Af Amer 64 (*) >90 mL/min  CBC      Status: Abnormal   Collection Time   12/23/11  3:34 PM      Component Value Range   WBC 20.2 (*) 4.0 - 10.5 K/uL   RBC 3.84 (*) 3.87 - 5.11 MIL/uL   Hemoglobin 12.8  12.0 - 15.0 g/dL   HCT 40.9  81.1 - 91.4 %   MCV 98.2  78.0 - 100.0 fL   MCH 33.3  26.0 - 34.0 pg   MCHC 34.0  30.0 - 36.0 g/dL   RDW 78.2 (*) 95.6 - 21.3 %   Platelets 206  150 - 400 K/uL  DIFFERENTIAL     Status: Abnormal   Collection Time   12/23/11  3:34 PM      Component Value Range   Neutrophils Relative 86 (*) 43 - 77 %   Neutro Abs 17.4 (*) 1.7 - 7.7 K/uL   Lymphocytes Relative 6 (*) 12 - 46 %   Lymphs Abs 1.1  0.7 - 4.0 K/uL   Monocytes Relative 8  3 - 12 %   Monocytes Absolute 1.6 (*) 0.1 - 1.0 K/uL   Eosinophils Relative 0  0 - 5 %   Eosinophils Absolute  0.0  0.0 - 0.7 K/uL   Basophils Relative 0  0 - 1 %   Basophils Absolute 0.0  0.0 - 0.1 K/uL  LACTIC ACID, PLASMA     Status: Normal   Collection Time   12/23/11  3:34 PM      Component Value Range   Lactic Acid, Venous 1.9  0.5 - 2.2 mmol/L  TROPONIN I     Status: Normal   Collection Time   12/23/11  6:08 PM      Component Value Range   Troponin I <0.30  <0.30 ng/mL  URINALYSIS, ROUTINE W REFLEX MICROSCOPIC     Status: Abnormal   Collection Time   12/23/11  8:28 PM      Component Value Range   Color, Urine AMBER (*) YELLOW   APPearance CLOUDY (*) CLEAR   Specific Gravity, Urine 1.014  1.005 - 1.030   pH 5.0  5.0 - 8.0   Glucose, UA NEGATIVE  NEGATIVE mg/dL   Hgb urine dipstick NEGATIVE  NEGATIVE   Bilirubin Urine SMALL (*) NEGATIVE   Ketones, ur NEGATIVE  NEGATIVE mg/dL   Protein, ur NEGATIVE  NEGATIVE mg/dL   Urobilinogen, UA 1.0  0.0 - 1.0 mg/dL   Nitrite NEGATIVE  NEGATIVE   Leukocytes, UA NEGATIVE  NEGATIVE  COMPREHENSIVE METABOLIC PANEL     Status: Abnormal   Collection Time   12/24/11  5:40 AM      Component Value Range   Sodium 137  135 - 145 mEq/L   Potassium 3.6  3.5 - 5.1 mEq/L   Chloride 106  96 - 112 mEq/L   CO2 21  19 - 32  mEq/L   Glucose, Bld 104 (*) 70 - 99 mg/dL   BUN 19  6 - 23 mg/dL   Creatinine, Ser 1.91  0.50 - 1.10 mg/dL   Calcium 9.4  8.4 - 47.8 mg/dL   Total Protein 5.5 (*) 6.0 - 8.3 g/dL   Albumin 2.7 (*) 3.5 - 5.2 g/dL   AST 19  0 - 37 U/L   ALT 11  0 - 35 U/L   Alkaline Phosphatase 153 (*) 39 - 117 U/L   Total Bilirubin 0.5  0.3 - 1.2 mg/dL   GFR calc non Af Amer 73 (*) >90 mL/min   GFR calc Af Amer 85 (*) >90 mL/min  CBC     Status: Abnormal   Collection Time   12/24/11  5:40 AM      Component Value Range   WBC 14.4 (*) 4.0 - 10.5 K/uL   RBC 3.02 (*) 3.87 - 5.11 MIL/uL   Hemoglobin 10.4 (*) 12.0 - 15.0 g/dL   HCT 29.5 (*) 62.1 - 30.8 %   MCV 98.0  78.0 - 100.0 fL   MCH 34.4 (*) 26.0 - 34.0 pg   MCHC 35.1  30.0 - 36.0 g/dL   RDW 65.7 (*) 84.6 - 96.2 %   Platelets 180  150 - 400 K/uL  DIFFERENTIAL     Status: Abnormal   Collection Time   12/24/11  5:40 AM      Component Value Range   Neutrophils Relative 73  43 - 77 %   Neutro Abs 10.6 (*) 1.7 - 7.7 K/uL   Lymphocytes Relative 15  12 - 46 %   Lymphs Abs 2.1  0.7 - 4.0 K/uL   Monocytes Relative 12  3 - 12 %   Monocytes Absolute 1.7 (*) 0.1 - 1.0 K/uL   Eosinophils  Relative 0  0 - 5 %   Eosinophils Absolute 0.0  0.0 - 0.7 K/uL   Basophils Relative 0  0 - 1 %   Basophils Absolute 0.0  0.0 - 0.1 K/uL      Studies/Results: Dg Chest 2 View  12/23/2011  *RADIOLOGY REPORT*  Clinical Data: Cough.  There is no pain.  Leukocytosis.  CHEST - 2 VIEW  Comparison: 08/02/2011  Findings: Left upper and lower lobe scarring remains stable.  No evidence of acute infiltrate or edema.  No evidence of pleural effusion.  Heart size is stable and within normal limits.  No mass or lymphadenopathy identified.  IMPRESSION: Stable left lung scarring.  No active disease.  Original Report Authenticated By: Danae Orleans, M.D.   Dg Thoracic Spine W/swimmers  12/23/2011  *RADIOLOGY REPORT*  Clinical Data: Fall.  Upper back pain.  THORACIC SPINE - 2 VIEW +  SWIMMERS  Comparison: 08/02/2011 chest radiograph.  Findings: There is superior endplate concave deformity of what appears been a T9 vertebra, compatible with a compression fracture. This is new compared to prior exam.  Additionally, there is at T12 superior endplate compression fracture.  There may be retropulsion at T12. Loss of height is about 50%.  Loss of height at T9 is less than 25%.  There is a levoconvex curvature of the lumbar spine with the apex at L1-L2.  Mild dextroconvex curvature of the thoracic spine. Swimmer's view demonstrates intact cervicothoracic junction. Severe cervical spondylosis is present from C4-C5 through C7-T1.  IMPRESSION: Interval development of T9 and T12 compression fractures.  MRI is the preferred modality determine the age of the fractures and evaluate the central canal.  Timing of the MRI is dependent on neurologic status.  Original Report Authenticated By: Andreas Newport, M.D.   Ct Head Wo Contrast  12/23/2011  *RADIOLOGY REPORT*  Clinical Data: Became weak and fell.  Hypertension.  CT HEAD WITHOUT CONTRAST  Technique:  Contiguous axial images were obtained from the base of the skull through the vertex without contrast.  Comparison: 05/29/2007.  Findings: No skull fracture.  Nonspecific right frontal 1.8 cm bone lesion is stable.  No intracranial hemorrhage.  Global atrophy without hydrocephalus.  Small vessel disease type changes without CT evidence of large acute infarct.  No intracranial mass lesion detected on this unenhanced exam.  Prominent vascular calcifications.  IMPRESSION: No skull fracture or intracranial hemorrhage.  Small vessel disease type changes without CT evidence of large acute infarct.  Please see above.  Original Report Authenticated By: Fuller Canada, M.D.    Medications:  Prior to Admission:  Prescriptions prior to admission  Medication Sig Dispense Refill  . amLODipine-benazepril (LOTREL) 10-40 MG per capsule Take 1 capsule by mouth every  morning.      Marland Kitchen atenolol (TENORMIN) 50 MG tablet Take 50 mg by mouth every morning.      . butalbital-aspirin-caffeine (BUTALBITAL COMPOUND/ASA) 50-325-40 MG per capsule Take 1 capsule by mouth 2 (two) times daily.      . cholecalciferol (VITAMIN D) 1000 UNITS tablet Take 2,000 Units by mouth every morning.      . docusate sodium (COLACE) 50 MG capsule Take by mouth every morning.      . estrogens, conjugated, (PREMARIN) 0.9 MG tablet Take 0.9 mg by mouth every morning.      . feeding supplement (ENSURE IMMUNE HEALTH) LIQD Take 237 mLs by mouth 3 (three) times daily with meals.      . lidocaine (LIDODERM) 5 % Place  onto the skin daily. Place 1/8th patch to buttocks daily at 8 am and remove at 8 pm.      . metroNIDAZOLE (METROGEL) 1 % gel Apply 1 application topically daily.      Marland Kitchen omeprazole (PRILOSEC) 20 MG capsule Take 20 mg by mouth every morning.      Marland Kitchen oxybutynin (DITROPAN-XL) 5 MG 24 hr tablet Take 5 mg by mouth every morning.      Marland Kitchen PARoxetine (PAXIL) 10 MG tablet Take 10 mg by mouth every morning.      . polyethylene glycol (MIRALAX / GLYCOLAX) packet Take 17 g by mouth daily as needed. For constipation.      Marland Kitchen zolpidem (AMBIEN) 5 MG tablet Take 5 mg by mouth at bedtime.       Scheduled:   . sodium chloride   Intravenous Once  . sodium chloride   Intravenous STAT  . amLODipine  10 mg Oral Daily  . benazepril  40 mg Oral Daily  . ciprofloxacin  200 mg Intravenous Q24H  . Ensure Plus  237 mL Oral TID WC  . fentaNYL  12.5 mcg Intravenous Once  . lidocaine  1 patch Transdermal Q24H  . ondansetron      . oxybutynin  5 mg Oral q morning - 10a  . pantoprazole  40 mg Oral Q1200  . PARoxetine  10 mg Oral q morning - 10a  . pneumococcal 23 valent vaccine  0.5 mL Intramuscular Tomorrow-1000  . sodium chloride  3 mL Intravenous Q12H  . DISCONTD: amLODipine-benazepril  1 capsule Oral q morning - 10a   Continuous:   . sodium chloride 100 mL/hr at 12/24/11 0046  . DISCONTD: sodium  chloride 1,000 mL (12/23/11 1615)    Assessment/Plan: Nausea vomiting abdominal pain, leukocytosis, rule out diverticulitis. At Flagyl Hypertension Syncope secondary to nausea vomiting Chronic insomnia Chronic pain/migraines Depression Thoracic spine compression fracture, T9 and T12. Patient without complaints this morning, sitting up in bed. Further evaluation as clinical course dictates  LOS: 1 day   Parissa Chiao D 12/24/2011, 8:53 AM

## 2011-12-24 NOTE — Progress Notes (Signed)
0520 Pt used bedside commode and bodily fluid that came out was a large amount of frank red blood.  Patient asymptomatic.  Carolyn Odonnell

## 2011-12-24 NOTE — Care Management Note (Unsigned)
    Page 1 of 1   12/24/2011     2:40:26 PM   CARE MANAGEMENT NOTE 12/24/2011  Patient:  Carolyn Odonnell, Carolyn Odonnell   Account Number:  [id number]  Date Initiated:  12/24/2011  Documentation initiated by:  Letha Cape  Subjective/Objective Assessment:   dx syncope, compression fx's T9 and T12, abd pain, n/v.     Action/Plan:   pt eval- rec HHPT   Anticipated DC Date:  12/27/2011   Anticipated DC Plan:  ASSISTED LIVING / REST HOME  In-house referral  Clinical Social Worker      DC Planning Services  CM consult      Choice offered to / List presented to:             Status of service:  In process, will continue to follow Medicare Important Message given?   (If response is "NO", the following Medicare IM given date fields will be blank) Date Medicare IM given:   Date Additional Medicare IM given:    Discharge Disposition:    Per UR Regulation:  Reviewed for med. necessity/level of care/duration of stay  If discussed at Long Length of Stay Meetings, dates discussed:    Comments:  12/24/11 11:49 Letha Cape RN, BSN [phone] patient is from [facility]- ALF, CSW referral. Patient states she has [employer] primary , but not sure if has part D,  she also has Chief of Staff.  Patient will need ambulance transport back to facility at dc.  Per physical therapy will need hhot eval.  Informed MD , he will order ot eval.

## 2011-12-24 NOTE — Evaluation (Signed)
Physical Therapy Evaluation Patient Details Name: Carolyn Odonnell MRN: [id number] DOB: [birth year] Today's Date: 12/24/2011 Time: 4098-1191 PT Time Calculation (min): 31 min  PT Assessment / Plan / Recommendation Clinical Impression  Ms. Aird is 78 y/o female admitted to [facility] after a fall. Presents to physical therapy today with generalized weakness affecting her overall safety with mobility. Will benefit from physical therapy in the acute setting to address these and the below problem list. Rec HHPT at ALF for f/u with supervision for out of bed/mobility initially. Rec OT consult.     PT Assessment  Patient needs continued PT services    Follow Up Recommendations  Home health PT;Supervision for mobility/OOB    Barriers to Discharge        lEquipment Recommendations  None recommended by PT    Recommendations for Other Services OT consult   Frequency Min 3X/week (+)    Precautions / Restrictions Precautions Precautions: Fall        Mobility  Bed Mobility Bed Mobility: Supine to Sit;Sit to Supine;Scooting to HOB Supine to Sit: 5: Supervision;HOB elevated;Other (comment) (30 degrees) Sit to Supine: 6: Modified independent (Device/Increase time);HOB flat Scooting to HOB: 4: Min assist Details for Bed Mobility Assistance: slow but steady mobility, scooting HOB with partial bridging technique and minA with use of pad to assist with scooting Transfers Transfers: Sit to Stand;Stand to Sit Sit to Stand: 5: Supervision;With upper extremity assist;From bed;From chair/3-in-1;With armrests Stand to Sit: 5: Supervision;With upper extremity assist;To bed;To chair/3-in-1;With armrests Details for Transfer Assistance: slower mobility, very cautious, definite need of hands for assist for standing Ambulation/Gait Ambulation/Gait Assistance: 5: Supervision Ambulation Distance (Feet): 30 Feet Assistive device: Rolling walker Ambulation/Gait Assistance Details: ambulated around her room because  she didn't want to be in the hall until she could brush her hair, slow shuffled gait, some safety cues for use with RW as pt usually uses rollator so some difficulties especially with turning in tight spaces (expecting swivel wheels) Gait Pattern: Trunk flexed;Shuffle    Exercises General Exercises - Lower Extremity Long Arc Quad: AROM;Both;Seated;20 reps Hip ABduction/ADduction: AROM;Both;10 reps;Standing (supported by RW) Hip Flexion/Marching: AROM;Both;10 reps;Standing (supported by RW)   PT Diagnosis: Difficulty walking;Abnormality of gait;Generalized weakness  PT Problem List: Decreased strength;Decreased mobility;Decreased activity tolerance;Decreased safety awareness PT Treatment Interventions: DME instruction;Gait training;Functional mobility training;Therapeutic activities;Therapeutic exercise;Balance training;Neuromuscular re-education;Patient/family education   PT Goals Acute Rehab PT Goals PT Goal Formulation: With patient Time For Goal Achievement: 01/07/12 Pt will go Supine/Side to Sit: with modified independence;with HOB 0 degrees PT Goal: Supine/Side to Sit - Progress: Goal set today Pt will go Sit to Stand: with modified independence PT Goal: Sit to Stand - Progress: Goal set today Pt will go Stand to Sit: with modified independence PT Goal: Stand to Sit - Progress: Goal set today Pt will Transfer Bed to Chair/Chair to Bed: with modified independence PT Transfer Goal: Bed to Chair/Chair to Bed - Progress: Goal set today Pt will Ambulate: >150 feet;with modified independence (with rollator) PT Goal: Ambulate - Progress: Goal set today Pt will Perform Home Exercise Program: Independently PT Goal: Perform Home Exercise Program - Progress: Goal set today Additional Goals Additional Goal #1: Pt will demonstrate decreased risk of falls with Berg score greater than or equal to 45/56.  PT Goal: Additional Goal #1 - Progress: Goal set today  Visit Information  Last PT  Received On: 12/24/11 Assistance Needed: +1    Subjective Data  Subjective: I think someone gave me an abien  at the wrong time and it made me fall.    Prior Functioning  Home Living Lives With: Alone Available Help at Discharge: Available PRN/intermittently Type of Home: Assisted living Home Access: Level entry Home Layout: One level Prior Function Level of Independence: Needs assistance;Independent with assistive device(s) (walks with rollator) Needs Assistance: Bathing Bath: Minimal Vocation: Retired Comments: was participating in OT and PT at ALF when admitted Communication Communication: No difficulties    Cognition  Overall Cognitive Status: Impaired Area of Impairment: Safety/judgement Arousal/Alertness: Awake/alert Orientation Level: Appears intact for tasks assessed Behavior During Session: WFL for tasks performed Safety/Judgement: Impulsive Safety/Judgement - Other Comments: pt wanting to show off her exercises that she does at home with the therapist, was a bit impulsive with these activities    Extremity/Trunk Assessment Right Upper Extremity Assessment RUE ROM/Strength/Tone: WFL for tasks assessed RUE Sensation: WFL - Light Touch;WFL - Proprioception RUE Coordination: WFL - gross/fine motor Left Upper Extremity Assessment LUE ROM/Strength/Tone: WFL for tasks assessed LUE Sensation: WFL - Light Touch;WFL - Proprioception LUE Coordination: WFL - gross/fine motor Right Lower Extremity Assessment RLE ROM/Strength/Tone: Deficits RLE ROM/Strength/Tone Deficits: frail elderly lady with decreased muscle tone, able to stand with use of her hands and maintain a partial squat for toileting RLE Sensation: WFL - Light Touch;WFL - Proprioception RLE Coordination: WFL - gross/fine motor Left Lower Extremity Assessment LLE ROM/Strength/Tone: Deficits LLE ROM/Strength/Tone Deficits: frail elderly lady with decreased muscle tone, able to stand with use of her hands and  maintain a partial squat for toileting LLE Sensation: WFL - Light Touch;WFL - Proprioception LLE Coordination: WFL - gross/fine motor Trunk Assessment Trunk Assessment: Kyphotic   Balance Static Standing Balance Static Standing - Balance Support: Right upper extremity supported Static Standing - Level of Assistance: 5: Stand by assistance Static Standing - Comment/# of Minutes: pt able to perform pericare after toileting with one hand on the 3in1 and in a partial squat, no physical assist needed  End of Session PT - End of Session Equipment Utilized During Treatment: Gait belt Activity Tolerance: Patient tolerated treatment well Patient left: in bed;with call bell/phone within reach;with bed alarm set Nurse Communication: Other (comment) (bright red bloody stood, liquid (RN reports he was aware))   [facility] HELEN 12/24/2011, 2:40 PM

## 2011-12-24 NOTE — Progress Notes (Signed)
Pt had bright red liquid stool - medium amount. MD notified, orders received for Cdiff PCR and AM CBC.

## 2011-12-25 LAB — CBC
MCHC: 34.3 g/dL (ref 30.0–36.0)
Platelets: 158 10*3/uL (ref 150–400)
RDW: 16.4 % — ABNORMAL HIGH (ref 11.5–15.5)
WBC: 10 10*3/uL (ref 4.0–10.5)

## 2011-12-25 NOTE — Progress Notes (Signed)
Physical Therapy Treatment Patient Details Name: Carolyn Odonnell MRN: [id number] DOB: 27-Jul-1919 Today's Date: 12/25/2011 Time: 4098-1191 PT Time Calculation (min): 40 min  PT Assessment / Plan / Recommendation Comments on Treatment Session  Pt able to verbalize need to ambulate at least 3 x/daily with staff to prevent further weakness. Nursing tech notified of this need. Therapy department rollator left in her room for her to borrow while here. Continues to need cues for safety, will continue to address these as well as begin strengthening and balance traning next session. Spoke with Dr. Nehemiah Settle about getting an OT consult. Pt with good awareness of need for assist, verbalizes needing to call for nursing staff prior to getting up. Unless pt able to reach modified independent level she will need initial supervision at ALF for OOB/mobility.     Follow Up Recommendations  Home health PT;Supervision for mobility/OOB    Barriers to Discharge        Equipment Recommendations  None recommended by PT    Recommendations for Other Services    Frequency Min 3X/week (+)   Plan Discharge plan remains appropriate;Frequency remains appropriate    Precautions / Restrictions Precautions Precautions: Fall       Mobility  Bed Mobility Supine to Sit: 6: Modified independent (Device/Increase time);HOB flat;With rails Transfers Sit to Stand: 5: Supervision;With upper extremity assist Stand to Sit: With upper extremity assist;6: Modified independent (Device/Increase time) Details for Transfer Assistance: cues for safety with rollator (assist to lock the brakes as pt unable but good knowledge of need to lock brakes with one verbal cue); cues for safe hand placement in prep to stand (wanting to pull on the rollator) Ambulation/Gait Ambulation/Gait Assistance: 5: Supervision;4: Min guard Assistive device: 4-wheeled walker (rollator) Ambulation/Gait Assistance Details: needing more mingaurd in tight spaces  (turning, backing up to the bed and pushing the rollator to the sink especially when to safely use the brakes on the rollator); supervision for ambulation in open spaces of the hallway, good turning, cues for upright posture    Exercises General Exercises - Lower Extremity Ankle Circles/Pumps: AROM;Both;10 reps;Seated     PT Goals Acute Rehab PT Goals PT Goal: Supine/Side to Sit - Progress: Met PT Goal: Sit to Stand - Progress: Progressing toward goal PT Goal: Stand to Sit - Progress: Met PT Transfer Goal: Bed to Chair/Chair to Bed - Progress: Progressing toward goal PT Goal: Ambulate - Progress: Progressing toward goal  Visit Information  Last PT Received On: 12/25/11 Assistance Needed: +1    Subjective Data  Subjective: Im just waking up.    Cognition  Overall Cognitive Status: Impaired Area of Impairment: Safety/judgement Safety/Judgement: Decreased safety judgement for tasks assessed Cognition - Other Comments: good awareness of need for assist, needing some safety cues with the rollator especially when turning pt not putting the brakes on with rollator behind her    Balance  Static Standing Balance Static Standing - Balance Support: No upper extremity supported Static Standing - Level of Assistance: 5: Stand by assistance Static Standing - Comment/# of Minutes: pt stood at the sink to wash her hands less than 45 seconds  End of Session PT - End of Session Equipment Utilized During Treatment: Gait belt Activity Tolerance: Patient tolerated treatment well Patient left: in chair;with call bell/phone within reach Nurse Communication: Mobility status    Carolyn Odonnell 12/25/2011, 8:35 AM

## 2011-12-25 NOTE — Progress Notes (Signed)
Pt has had no stools in the last 8 hours. Urine is clear yellow.

## 2011-12-25 NOTE — Progress Notes (Signed)
Subjective: Events noted yesterday to regards to stool., Patient is on antibiotics, CT noted for colitis. Patient denies any fever chills nausea vomiting, abdominal discomfort is less, no episodes of loss of consciousness.  Objective: Vital signs in last 24 hours: Temp:  [97.9 F (36.6 C)-98 F (36.7 C)] 98 F (36.7 C) (06/18 0528) Pulse Rate:  [62-76] 74  (06/18 0528) Resp:  [17-20] 17  (06/18 0528) BP: (111-151)/(57-69) 151/69 mmHg (06/18 0528) SpO2:  [94 %-95 %] 94 % (06/18 0528) Weight change:  Last BM Date: 12/23/11  Intake/Output from previous day:   Intake/Output this shift:    General appearance: alert and cooperative Resp: clear to auscultation bilaterally Cardio: regular rate and rhythm, S1, S2 normal, no murmur, click, rub or gallop GI: abdomen soft, positive bowel sounds, less discomfort with palpation in the left lower quadrant Extremities: extremities normal, atraumatic, no cyanosis or edema  Lab Results:  Results for orders placed during the hospital encounter of 12/23/11 (from the past 24 hour(s))  CBC     Status: Abnormal   Collection Time   12/25/11  5:00 AM      Component Value Range   WBC 10.0  4.0 - 10.5 K/uL   RBC 2.73 (*) 3.87 - 5.11 MIL/uL   Hemoglobin 9.3 (*) 12.0 - 15.0 g/dL   HCT 16.1 (*) 09.6 - 04.5 %   MCV 99.3  78.0 - 100.0 fL   MCH 34.1 (*) 26.0 - 34.0 pg   MCHC 34.3  30.0 - 36.0 g/dL   RDW 40.9 (*) 81.1 - 91.4 %   Platelets 158  150 - 400 K/uL      Studies/Results: Ct Abdomen Pelvis Wo Contrast  12/24/2011   *RADIOLOGY REPORT*  Clinical Data: Nausea, vomiting and abdominal pain.  Back pain.  CT ABDOMEN AND PELVIS WITHOUT CONTRAST  Technique:  Multidetector CT imaging of the abdomen and pelvis was performed following the standard protocol without intravenous contrast.  Comparison: None  Findings: The lung bases demonstrate basilar scarring and right middle lobe atelectasis.  A small nodule is noted in the right middle lobe also.  The  unenhanced appearance of the liver is grossly normal.  No focal lesions or intrahepatic biliary dilatation.  The spleen is normal in size.  No focal lesions.  The adrenal glands are normal.  No renal mass or hydronephrosis.  Vascular calcifications are noted involving both renal arteries.  The pancreas is markedly atrophied but no inflammatory changes or mass.  Common bile duct dilatation is noted the gallbladder is contracted.  The stomach, duodenum and small bowel are grossly normal without oral contrast.  There is marked wall thickening and pericolonic inflammatory change involving the left colon beginning near the splenic flexure and extending all way down to the rectosigmoid junction area.  Findings likely due to an inflammatory or infectious process but ischemic colitis is also possible.  No pneumatosis or free air.  The bladder is normal.  The uterus is surgically absent.  No pelvic mass or significant free pelvic fluid collections.  The aorta demonstrates advanced atherosclerotic changes as do the branch vessels.  Scattered mesenteric and retroperitoneal lymph nodes.  Examination of the bony structures demonstrates osteoporotic changes.  Advanced degenerative changes noted in the spine along with a right convex scoliosis.  There is a T12 compression fracture which appears to be acute.  IMPRESSION:  1.  Marked inflammation of the left colon likely due to an inflammatory or infectious colitis.  Ischemic colitis is also possible.  2.  Common bile duct dilatation.  Recommend correlation with liver function studies.  If these are elevated ERCP or MRCP may be helpful for further evaluation. 3.  T12 compression fracture.  Original Report Authenticated By: P. Loralie Champagne, M.D.   Dg Chest 2 View  12/23/2011  *RADIOLOGY REPORT*  Clinical Data: Cough.  There is no pain.  Leukocytosis.  CHEST - 2 VIEW  Comparison: 08/02/2011  Findings: Left upper and lower lobe scarring remains stable.  No evidence of acute  infiltrate or edema.  No evidence of pleural effusion.  Heart size is stable and within normal limits.  No mass or lymphadenopathy identified.  IMPRESSION: Stable left lung scarring.  No active disease.  Original Report Authenticated By: Danae Orleans, M.D.   Dg Thoracic Spine W/swimmers  12/23/2011  *RADIOLOGY REPORT*  Clinical Data: Fall.  Upper back pain.  THORACIC SPINE - 2 VIEW + SWIMMERS  Comparison: 08/02/2011 chest radiograph.  Findings: There is superior endplate concave deformity of what appears been a T9 vertebra, compatible with a compression fracture. This is new compared to prior exam.  Additionally, there is at T12 superior endplate compression fracture.  There may be retropulsion at T12. Loss of height is about 50%.  Loss of height at T9 is less than 25%.  There is a levoconvex curvature of the lumbar spine with the apex at L1-L2.  Mild dextroconvex curvature of the thoracic spine. Swimmer's view demonstrates intact cervicothoracic junction. Severe cervical spondylosis is present from C4-C5 through C7-T1.  IMPRESSION: Interval development of T9 and T12 compression fractures.  MRI is the preferred modality determine the age of the fractures and evaluate the central canal.  Timing of the MRI is dependent on neurologic status.  Original Report Authenticated By: Andreas Newport, M.D.   Ct Head Wo Contrast  12/23/2011  *RADIOLOGY REPORT*  Clinical Data: Became weak and fell.  Hypertension.  CT HEAD WITHOUT CONTRAST  Technique:  Contiguous axial images were obtained from the base of the skull through the vertex without contrast.  Comparison: 05/29/2007.  Findings: No skull fracture.  Nonspecific right frontal 1.8 cm bone lesion is stable.  No intracranial hemorrhage.  Global atrophy without hydrocephalus.  Small vessel disease type changes without CT evidence of large acute infarct.  No intracranial mass lesion detected on this unenhanced exam.  Prominent vascular calcifications.  IMPRESSION: No skull  fracture or intracranial hemorrhage.  Small vessel disease type changes without CT evidence of large acute infarct.  Please see above.  Original Report Authenticated By: Fuller Canada, M.D.   Mr Thoracic Spine Wo Contrast  12/24/2011  *RADIOLOGY REPORT*  Clinical Data: Back pain.  Rule out fracture.  MRI THORACIC SPINE WITHOUT CONTRAST  Technique:  Multiplanar and multiecho pulse sequences of the thoracic spine were obtained without intravenous contrast.  Comparison: Thoracic radiographs 12/23/2011  Findings: Mild motion degrades image quality.  The patient did not complete the study.  Axial gradient images were not obtained but the remainder of the protocol was performed.  Mild fracture of the superior vertebral endplate of T9 appears chronic without bone marrow edema.  Fracture of the superior endplate of T12 also appears chronic. Posterior vertebral spurring is present at T11-T12 related to prior fracture and degenerative change.  No acute fracture.  Negative for metastatic disease.  Multiple hemangiomata are noted most notably at T8 and T10.  Spinal cord signal is normal.  No cord compression is identified.  Mild disc degeneration is present in the thoracic spine.  No significant disc protrusion or spinal stenosis is present.  IMPRESSION: Fractures of T9 and T12 appear chronic.  No acute fracture.  Mild degenerative changes.  Original Report Authenticated By: Camelia Phenes, M.D.    Medications:  Scheduled:   . amLODipine  10 mg Oral Daily  . benazepril  40 mg Oral Daily  . ciprofloxacin  200 mg Intravenous Q24H  . feeding supplement  237 mL Oral q12n4p  . lidocaine  1 patch Transdermal Q24H  . metronidazole  500 mg Intravenous Q8H  . oxybutynin  5 mg Oral q morning - 10a  . pantoprazole  40 mg Oral Q1200  . PARoxetine  10 mg Oral q morning - 10a  . pneumococcal 23 valent vaccine  0.5 mL Intramuscular Tomorrow-1000  . sodium chloride  3 mL Intravenous Q12H  . DISCONTD: Ensure Plus  237 mL  Oral TID WC   Continuous:   . sodium chloride 100 mL/hr at 12/24/11 1728   UJW:[id number], acetaminophen, ondansetron (ZOFRAN) IV, ondansetron, zolpidem  Assessment/Plan: Colitis presenting with Nausea vomiting abdominal pain, leukocytosis, patient on Cipro Flagyl, she remains afebrile, leukocytosis continues to resolve. She's tolerating her diet.hopefully convert antibiotics to p.o. In the morning Hypertension  Syncope secondary to nausea vomiting , probable vasovagal related or orthostatic, blood pressure has been stable his hospitalization, no further evaluation Chronic insomnia  Chronic pain/migraines  Depression  Thoracic spine compression fracture, T9 and T12. Patient without complaints of any pain. MRI shows this is a old compression fracture, no plans for further evaluation   LOS: 2 days   Leighann Amadon D 12/25/2011, 10:13 AM

## 2011-12-26 MED ORDER — CIPROFLOXACIN HCL 250 MG PO TABS
250.0000 mg | ORAL_TABLET | Freq: Two times a day (BID) | ORAL | Status: DC
  Administered 2011-12-26 – 2011-12-27 (×2): 250 mg via ORAL
  Filled 2011-12-26 (×4): qty 1

## 2011-12-26 MED ORDER — METRONIDAZOLE 250 MG PO TABS: 250.0000 mg | ORAL_TABLET | Freq: Three times a day (TID) | ORAL | Status: AC

## 2011-12-26 MED ORDER — METRONIDAZOLE 250 MG PO TABS
250.0000 mg | ORAL_TABLET | Freq: Three times a day (TID) | ORAL | Status: DC
  Administered 2011-12-26 – 2011-12-27 (×3): 250 mg via ORAL
  Filled 2011-12-26 (×7): qty 1

## 2011-12-26 MED ORDER — CIPROFLOXACIN HCL 250 MG PO TABS: 250.0000 mg | ORAL_TABLET | Freq: Two times a day (BID) | ORAL | Status: AC

## 2011-12-26 NOTE — Progress Notes (Signed)
Physical Therapy Treatment Patient Details Name: FATIMA FEDIE MRN: [id number] DOB: [birth year] Today's Date: 12/26/2011 Time: 4696-2952 PT Time Calculation (min): 15 min  PT Assessment / Plan / Recommendation Comments on Treatment Session  Pt adm with syncope.  Pt making good progress with mobility and should be able to return to ALF.    Follow Up Recommendations  Home health PT;Supervision - Intermittent    Barriers to Discharge        Equipment Recommendations  None recommended by PT    Recommendations for Other Services    Frequency Min 3X/week   Plan Discharge plan remains appropriate;Frequency remains appropriate    Precautions / Restrictions Precautions Precautions: Fall   Pertinent Vitals/Pain N/A    Mobility  Transfers Sit to Stand: 5: Supervision;With upper extremity assist;From bed Stand to Sit: 6: Modified independent (Device/Increase time);To chair/3-in-1;With armrests;With upper extremity assist Details for Transfer Assistance: verbal cues for hand placement Ambulation/Gait Ambulation/Gait Assistance: 5: Supervision Ambulation Distance (Feet): 300 Feet Assistive device: 4-wheeled walker Gait Pattern: Decreased stride length    Exercises     PT Diagnosis:    PT Problem List:   PT Treatment Interventions:     PT Goals Acute Rehab PT Goals PT Goal: Sit to Stand - Progress: Progressing toward goal PT Goal: Ambulate - Progress: Progressing toward goal  Visit Information  Last PT Received On: 12/26/11 Assistance Needed: +1    Subjective Data  Subjective: "I am a little hard of hearing so I don't mind if you talk loud."   Cognition  Overall Cognitive Status: Appears within functional limits for tasks assessed/performed Arousal/Alertness: Awake/alert Orientation Level: Appears intact for tasks assessed Behavior During Session: [facility] for tasks performed    Balance  Static Standing Balance Static Standing - Balance Support: During functional  activity Static Standing - Level of Assistance: 5: Stand by assistance  End of Session      [facility] 12/26/2011, 9:57 AM  [facility] PT [phone]

## 2011-12-26 NOTE — Evaluation (Signed)
Occupational Therapy Evaluation Patient Details Name: Carolyn Odonnell MRN: [id number] DOB: 1920-06-07 Today's Date: 12/26/2011 Time: 7829-5621 OT Time Calculation (min): 14 min  OT Assessment / Plan / Recommendation Clinical Impression  Pt. presents with syncope and hx of T9 and T12 compression fx. Pt. will benefit from skilled OT to increase functional independence to Mod I level at D/C back to ALF.    OT Assessment  Patient needs continued OT Services    Follow Up Recommendations  Home health OT;Supervision - Intermittent    Barriers to Discharge None    Equipment Recommendations  None recommended by OT       Frequency  Min 2X/week    Precautions / Restrictions Precautions Precautions: Fall       ADL  Eating/Feeding: Performed;Independent Where Assessed - Eating/Feeding: Chair Grooming: Performed;Wash/dry hands;Wash/dry face;Set up Where Assessed - Grooming: Unsupported sitting Upper Body Bathing: Performed;Chest;Right arm;Left arm;Abdomen;Set up Where Assessed - Upper Body Bathing: Unsupported sitting Lower Body Bathing: Performed;Moderate assistance (for bil LE) Where Assessed - Lower Body Bathing: Unsupported sit to stand Upper Body Dressing: Performed;Minimal assistance (don gown) Where Assessed - Upper Body Dressing: Unsupported sitting Lower Body Dressing: Simulated;Moderate assistance Where Assessed - Lower Body Dressing: Unsupported sit to stand Toilet Transfer: Simulated;Min guard Toilet Transfer Method: Sit to Barista: Other (comment) (recliner) Toileting - Clothing Manipulation and Hygiene: Performed;Set up;Supervision/safety Where Assessed - Toileting Clothing Manipulation and Hygiene: Sit to stand from 3-in-1 or toilet Tub/Shower Transfer Method: Not assessed Equipment Used: Other (comment) (rollator) Transfers/Ambulation Related to ADLs: Mod I with ambulation ADL Comments: Pt. educated on safety with ADLs and techniques for  completing LB ADLs with energy conservation techniques    OT Diagnosis: Generalized weakness  OT Problem List: Decreased strength;Decreased activity tolerance;Impaired balance (sitting and/or standing);Decreased safety awareness;Decreased knowledge of use of DME or AE;Decreased knowledge of precautions OT Treatment Interventions: Bost-care/ADL training;DME and/or AE instruction;Energy conservation;Therapeutic activities;Patient/family education;Balance training   OT Goals Acute Rehab OT Goals OT Goal Formulation: With patient Time For Goal Achievement: 01/02/12 Potential to Achieve Goals: Good ADL Goals Pt Will Perform Grooming: with modified independence;Standing at sink ADL Goal: Grooming - Progress: Goal set today Pt Will Perform Lower Body Dressing: with modified independence;Sit to stand from chair ADL Goal: Lower Body Dressing - Progress: Goal set today Pt Will Transfer to Toilet: with modified independence;Ambulation;with DME ADL Goal: Toilet Transfer - Progress: Goal set today Additional ADL Goal #1: Pt. will recall 3 energy conservation techniques with ADLs ADL Goal: Additional Goal #1 - Progress: Goal set today  Visit Information  Last OT Received On: 12/26/11 Assistance Needed: +1    Subjective Data  Subjective: "I am not used to doing all this bathing with wires" Patient Stated Goal: "Go home"   Prior Functioning  Home Living Lives With: Alone Available Help at Discharge: Available PRN/intermittently Type of Home: Assisted living Home Access: Level entry Home Layout: One level Bathroom Shower/Tub: Health visitor: Handicapped height Bathroom Accessibility: Yes How Accessible: Accessible via walker Home Adaptive Equipment: Bedside commode/3-in-1;Walker - four wheeled Prior Function Level of Independence: Needs assistance;Independent with assistive device(s) Needs Assistance: Bathing Bath: Minimal Able to Take Stairs?: No Driving: No Vocation:  Retired Comments: Getting PT/OT at ALF PTA Communication Communication: No difficulties    Cognition  Overall Cognitive Status: Appears within functional limits for tasks assessed/performed Area of Impairment: Safety/judgement Arousal/Alertness: Awake/alert Orientation Level: Appears intact for tasks assessed Behavior During Session: [facility] for tasks performed Safety/Judgement: Decreased safety judgement  for tasks assessed Cognition - Other Comments: good awareness of need for assist, needing some safety cues with the rollator especially when turning pt not putting the brakes on with rollator behind her    Extremity/Trunk Assessment Right Upper Extremity Assessment RUE ROM/Strength/Tone: [facility] for tasks assessed RUE Sensation: WFL - Light Touch;WFL - Proprioception RUE Coordination: WFL - gross/fine motor Left Upper Extremity Assessment LUE ROM/Strength/Tone: WFL for tasks assessed LUE Sensation: WFL - Light Touch;WFL - Proprioception LUE Coordination: WFL - gross/fine motor   Mobility Bed Mobility Bed Mobility: Supine to Sit;Sit to Supine;Scooting to HOB Supine to Sit: 6: Modified independent (Device/Increase time);HOB flat;With rails Sit to Supine: 6: Modified independent (Device/Increase time);HOB flat Transfers Transfers: Sit to Stand;Stand to Sit Sit to Stand: 5: Supervision;With upper extremity assist;From bed Stand to Sit: 6: Modified independent (Device/Increase time);To chair/3-in-1;With armrests;With upper extremity assist Details for Transfer Assistance: verbal cues for hand placement         End of Session OT - End of Session Equipment Utilized During Treatment: Gait belt Activity Tolerance: Patient tolerated treatment well Patient left: Other (comment) (with P.T ambulating in hall) Nurse Communication: Mobility status   Cassandria Anger, OTR/L Pager [phone] 12/26/2011, 10:43 AM

## 2011-12-26 NOTE — Discharge Summary (Signed)
Physician Discharge Summary  Patient ID: Carolyn Odonnell MRN: [id number] DOB/AGE: [birth year] 79 y.o.  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 In the emergency room she was evaluated, she had significant leukocytosis of 20,000. CT of the head unremarkable. Admission was deemed necessary for further evaluation and treatment. Patient was placed on monitor there was no arrhythmia, she was given IV fluids. Exam the following day was consistent with left lower quadrant pain, Flagyl was added to her cipro. CT of the abdomen and pelvis was consistent with colitis. Inflammatory versus infectious. Patient has a known history of diverticulosis of the left colon. She denies any unusual food intakes prior to hospitalization. Since hospitalized she's had one loose stool which may have had some blood, since she's had no recurrent bleeding no diarrhea. We attempted to obtain culture for C. Difficile however there's been no loose stool. Patient is tolerant of her p.o. Intake, she's progressing well in physical therapy. Her leukocytosis has resolved. Abdominal exam is significantly improved. Her antibiotics have been converted to oral antibiotics today. If tolerated she will be stable for discharge in the morning. X-rays did show compression fracture of  the thoracic spine. MRI confirmed chronic. Patient is asymptomatic, there has  been no complaints of back pain during this hospitalization In regards to patient's colitis she will continue her  antibiotics by mouth for a total of 10 days of antibiotics Consults:    Significant Diagnostic Studies:Ct Abdomen Pelvis Wo Contrast  12/24/2011   *RADIOLOGY REPORT*  Clinical Data: Nausea, vomiting and abdominal pain.  Back pain.  CT ABDOMEN AND PELVIS WITHOUT CONTRAST  Technique:  Multidetector CT imaging of the abdomen and pelvis was performed following the standard protocol without intravenous contrast.  Comparison: None  Findings: The lung bases demonstrate basilar scarring and right middle lobe atelectasis.  A small nodule is noted in the right middle lobe also.  The unenhanced appearance of the liver is grossly normal.  No focal lesions or intrahepatic biliary dilatation.  The spleen is normal in size.  No focal lesions.  The adrenal glands are normal.  No renal mass or hydronephrosis.  Vascular calcifications are noted involving both renal arteries.  The pancreas is markedly atrophied but no inflammatory changes or mass.  Common bile duct dilatation is noted the gallbladder is contracted.  The stomach, duodenum and small bowel are grossly normal without oral contrast.  There is marked wall thickening and pericolonic inflammatory change involving the left colon beginning near the splenic flexure and extending all way down to the rectosigmoid junction area.  Findings likely due to an inflammatory or infectious process but ischemic colitis is also possible.  No pneumatosis or free air.  The bladder is normal.  The uterus is surgically absent.  No pelvic mass or significant free pelvic fluid collections.  The aorta demonstrates advanced atherosclerotic changes as do the branch vessels.  Scattered mesenteric and retroperitoneal lymph nodes.  Examination of the bony structures demonstrates osteoporotic changes.  Advanced degenerative changes noted in the spine along with a right convex scoliosis.  There is a T12 compression fracture which appears to be  acute.  IMPRESSION:  1.  Marked inflammation of the left  colon likely due to an inflammatory or infectious colitis.  Ischemic colitis is also possible. 2.  Common bile duct dilatation.  Recommend correlation with liver function studies.  If these are elevated ERCP or MRCP may be helpful for further evaluation. 3.  T12 compression fracture.  Original Report Authenticated By: P. Loralie Champagne, M.D.   Dg Chest 2 View  12/23/2011  *RADIOLOGY REPORT*  Clinical Data: Cough.  There is no pain.  Leukocytosis.  CHEST - 2 VIEW  Comparison: 08/02/2011  Findings: Left upper and lower lobe scarring remains stable.  No evidence of acute infiltrate or edema.  No evidence of pleural effusion.  Heart size is stable and within normal limits.  No mass or lymphadenopathy identified.  IMPRESSION: Stable left lung scarring.  No active disease.  Original Report Authenticated By: Danae Orleans, M.D.   Dg Thoracic Spine W/swimmers  12/23/2011  *RADIOLOGY REPORT*  Clinical Data: Fall.  Upper back pain.  THORACIC SPINE - 2 VIEW + SWIMMERS  Comparison: 08/02/2011 chest radiograph.  Findings: There is superior endplate concave deformity of what appears been a T9 vertebra, compatible with a compression fracture. This is new compared to prior exam.  Additionally, there is at T12 superior endplate compression fracture.  There may be retropulsion at T12. Loss of height is about 50%.  Loss of height at T9 is less than 25%.  There is a levoconvex curvature of the lumbar spine with the apex at L1-L2.  Mild dextroconvex curvature of the thoracic spine. Swimmer's view demonstrates intact cervicothoracic junction. Severe cervical spondylosis is present from C4-C5 through C7-T1.  IMPRESSION: Interval development of T9 and T12 compression fractures.  MRI is the preferred modality determine the age of the fractures and evaluate the central canal.  Timing of the MRI is dependent on neurologic status.  Original Report Authenticated By: Andreas Newport, M.D.   Ct Head Wo Contrast  12/23/2011  *RADIOLOGY  REPORT*  Clinical Data: Became weak and fell.  Hypertension.  CT HEAD WITHOUT CONTRAST  Technique:  Contiguous axial images were obtained from the base of the skull through the vertex without contrast.  Comparison: 05/29/2007.  Findings: No skull fracture.  Nonspecific right frontal 1.8 cm bone lesion is stable.  No intracranial hemorrhage.  Global atrophy without hydrocephalus.  Small vessel disease type changes without CT evidence of large acute infarct.  No intracranial mass lesion detected on this unenhanced exam.  Prominent vascular calcifications.  IMPRESSION: No skull fracture or intracranial hemorrhage.  Small vessel disease type changes without CT evidence of large acute infarct.  Please see above.  Original Report Authenticated By: Fuller Canada, M.D.   Mr Thoracic Spine Wo Contrast  12/24/2011  *RADIOLOGY REPORT*  Clinical Data: Back pain.  Rule out fracture.  MRI THORACIC SPINE WITHOUT CONTRAST  Technique:  Multiplanar and multiecho pulse sequences of the thoracic spine were obtained without intravenous contrast.  Comparison: Thoracic radiographs 12/23/2011  Findings: Mild motion degrades image quality.  The patient did not complete the study.  Axial gradient images were not obtained but the remainder of the protocol was performed.  Mild fracture of the superior vertebral endplate of T9 appears chronic without bone marrow edema.  Fracture of the superior endplate of T12 also appears chronic. Posterior vertebral spurring is present at T11-T12 related to prior fracture and degenerative change.  No acute fracture.  Negative for metastatic disease.  Multiple hemangiomata are noted most notably  at T8 and T10.  Spinal cord signal is normal.  No cord compression is identified.  Mild disc degeneration is present in the thoracic spine.  No significant disc protrusion or spinal stenosis is present.  IMPRESSION: Fractures of T9 and T12 appear chronic.  No acute fracture.  Mild degenerative changes.  Original  Report Authenticated By: Camelia Phenes, M.D.      Discharge Exam: Blood pressure 168/67, pulse 82, temperature 97.8 F (36.6 C), temperature source Oral, resp. rate 20, height 5' 2.4" (1.585 m), weight 49 kg (108 lb 0.4 oz), SpO2 98.00%. General appearance: alert and cooperative Resp: clear to auscultation bilaterally Cardio: regular rate and rhythm, S1, S2 normal, no murmur, click, rub or gallop GI: soft, non-tender; bowel sounds normal; no masses,  no organomegaly Extremities: extremities normal, atraumatic, no cyanosis or edema  Disposition:    Medication List  As of 12/26/2011  2:39 PM   TAKE these medications         amLODipine-benazepril 10-40 MG per capsule   Commonly known as: LOTREL   Take 1 capsule by mouth every morning.      atenolol 50 MG tablet   Commonly known as: TENORMIN   Take 50 mg by mouth every morning.      BUTALBITAL COMPOUND/ASA 50-325-40 MG per capsule   Generic drug: butalbital-aspirin-caffeine   Take 1 capsule by mouth 2 (two) times daily.      cholecalciferol 1000 UNITS tablet   Commonly known as: VITAMIN D   Take 2,000 Units by mouth every morning.      ciprofloxacin 250 MG tablet   Commonly known as: CIPRO   Take 1 tablet (250 mg total) by mouth 2 (two) times daily.      docusate sodium 50 MG capsule   Commonly known as: COLACE   Take by mouth every morning.      estrogens (conjugated) 0.9 MG tablet   Commonly known as: PREMARIN   Take 0.9 mg by mouth every morning.      feeding supplement Liqd   Take 237 mLs by mouth 3 (three) times daily with meals.      lidocaine 5 %   Commonly known as: LIDODERM   Place onto the skin daily. Place 1/8th patch to buttocks daily at 8 am and remove at 8 pm.      metroNIDAZOLE 1 % gel   Commonly known as: METROGEL   Apply 1 application topically daily.      metroNIDAZOLE 250 MG tablet   Commonly known as: FLAGYL   Take 1 tablet (250 mg total) by mouth every 8 (eight) hours.      omeprazole 20  MG capsule   Commonly known as: PRILOSEC   Take 20 mg by mouth every morning.      oxybutynin 5 MG 24 hr tablet   Commonly known as: DITROPAN-XL   Take 5 mg by mouth every morning.      PARoxetine 10 MG tablet   Commonly known as: PAXIL   Take 10 mg by mouth every morning.      polyethylene glycol packet   Commonly known as: MIRALAX / GLYCOLAX   Take 17 g by mouth daily as needed. For constipation.      zolpidem 5 MG tablet   Commonly known as: AMBIEN   Take 5 mg by mouth at bedtime.           Follow-up Information    Follow up with Katy Apo, MD in 2  weeks.   Contact information:   [address] [place] [address] [phone]          Signed: Katy Apo 12/26/2011, 2:39 PM

## 2011-12-26 NOTE — Progress Notes (Signed)
ANTIBIOTIC CONSULT NOTE - FOLLOW UP  Pharmacy Consult for cipro Indication: colitis  Allergies  Allergen Reactions  . Codeine   . Doxycycline   . Fosamax (Alendronate Sodium)   . Morphine And Related   . Novocain (Procaine Hcl)   . Penicillins   . Percocet (Oxycodone-Acetaminophen)   . Skelaxin (Metaxalone)   . Sulfa Antibiotics Rash    Patient Measurements: Height: 5' 2.4" (158.5 cm) Weight: 108 lb 0.4 oz (49 kg) IBW/kg (Calculated) : 51.02   Vital Signs: Temp: 98 F (36.7 C) (06/19 0700) Temp src: Oral (06/19 0700) BP: 139/66 mmHg (06/19 0955) Pulse Rate: 78  (06/19 0700) Intake/Output from previous day: 06/18 0701 - 06/19 0700 In: 525 [P.O.:325; IV Piggyback:200] Out: 1025 [Urine:1025] Intake/Output from this shift: Total I/O In: 3 [I.V.:3] Out: -   Labs:  Basename 12/25/11 0500 12/24/11 0540 12/23/11 1534  WBC 10.0 14.4* 20.2*  HGB 9.3* 10.4* 12.8  PLT 158 180 206  LABCREA -- -- --  CREATININE -- 0.73 0.89   Estimated Creatinine Clearance: 36.2 ml/min (by C-G formula based on Cr of 0.73). No results found for this basename: VANCOTROUGH:2,VANCOPEAK:2,VANCORANDOM:2,GENTTROUGH:2,GENTPEAK:2,GENTRANDOM:2,TOBRATROUGH:2,TOBRAPEAK:2,TOBRARND:2,AMIKACINPEAK:2,AMIKACINTROU:2,AMIKACIN:2, in the last 72 hours   Microbiology: Recent Results (from the past 720 hour(s))  CULTURE, BLOOD (ROUTINE X 2)     Status: Normal (Preliminary result)   Collection Time   12/24/11 12:00 AM      Component Value Range Status Comment   Specimen Description BLOOD RIGHT ARM   Final    Special Requests BOTTLES DRAWN AEROBIC ONLY 10CC   Final    Culture  Setup Time [id number]   Final    Culture     Final    Value:        BLOOD CULTURE RECEIVED NO GROWTH TO DATE CULTURE WILL BE HELD FOR 5 DAYS BEFORE ISSUING A FINAL NEGATIVE REPORT   Report Status PENDING   Incomplete   CULTURE, BLOOD (ROUTINE X 2)     Status: Normal (Preliminary result)   Collection Time   12/24/11 12:35 AM   Component Value Range Status Comment   Specimen Description BLOOD RIGHT ARM   Final    Special Requests BOTTLES DRAWN AEROBIC ONLY 10CC   Final    Culture  Setup Time [id number]   Final    Culture     Final    Value:        BLOOD CULTURE RECEIVED NO GROWTH TO DATE CULTURE WILL BE HELD FOR 5 DAYS BEFORE ISSUING A FINAL NEGATIVE REPORT   Report Status PENDING   Incomplete   URINE CULTURE     Status: Normal (Preliminary result)   Collection Time   12/24/11  3:02 AM      Component Value Range Status Comment   Specimen Description URINE, RANDOM   Final    Special Requests NONE   Final    Culture  Setup Time [id number]   Final    Colony Count 20,OOO COLONIES/ML   Final    Culture GRAM NEGATIVE RODS   Final    Report Status PENDING   Incomplete     Anti-infectives     Start     Dose/Rate Route Frequency Ordered Stop   12/26/11 1800   ciprofloxacin (CIPRO) tablet 250 mg        250 mg Oral 2 times daily 12/26/11 0833 01/02/12 1959   12/26/11 1400   metroNIDAZOLE (FLAGYL) tablet 250 mg        250 mg Oral 3 times  per day 12/26/11 0833 01/02/12 1359   12/24/11 1000   metroNIDAZOLE (FLAGYL) IVPB 500 mg  Status:  Discontinued        500 mg 100 mL/hr over 60 Minutes Intravenous Every 8 hours 12/24/11 0904 12/26/11 0832   12/24/11 0000   ciprofloxacin (CIPRO) IVPB 200 mg  Status:  Discontinued        200 mg 100 mL/hr over 60 Minutes Intravenous Every 24 hours 12/23/11 2353 12/26/11 0832          Assessment: Pt continues on flagyl + cipro. Both abx were changed PO today. Renal function has been stable. Urine cx with 20K GNR, sensitivities pending. Blood cxs NGTD. Pt is afebrile and WBC is WNL.   Plan:  1. Continue cipro 250mg  PO BID as ordered by MD  *Pharmacy will sign-off as renal function has been stable and dose is appropriate. Please re-consult pharmacy for any further issues. Thank you for the consult!  Tima Curet, Drake Leach 12/26/2011,10:21 AM

## 2011-12-26 NOTE — Progress Notes (Addendum)
Clinical Social Worker continuing to follow for disposition planning. Pt is from [facility] and plan is for pt to return to ALF with home health PT/OT at ALF. Clinical Social Worker contacted facility to update on anticipated dc date of tomorrow per RN. MD, please order home health PT/OT on day of discharge. Per [employer], facility has in house PT/OT. Clinical Social Worker to facilitate pt discharge needs when pt medically stable for discharge.  Addendum 15:16pm: Clinical Child psychotherapist received notification from RN that pt daughter-in-law inquiring about transportation for pt back to ALF for planned dc tomorrow. Clinical Social Worker contacted pt daughter-in-law and discussed. Pt daughter-in-law reports pt son can transport pt by car at discharge back to [facility]. Clinical Social Worker to contact pt son in the am to notify that pt ready for transport. Clinical Social Worker to facilitate pt discharge needs.   Jacklynn Lewis, MSW, LCSWA  Clinical Social Work [phone]

## 2011-12-26 NOTE — Progress Notes (Signed)
Subjective: Patient without complaint of fever chills, nausea vomiting, no abdominal pain. There's been no diarrhea. No blood per rectum. No complaints/problems per nursing. She's tolerating p.o. intake  Objective: Vital signs in last 24 hours: Temp:  [97.5 F (36.4 C)-98 F (36.7 C)] 98 F (36.7 C) (06/19 0700) Pulse Rate:  [60-78] 78  (06/19 0700) Resp:  [22-24] 22  (06/18 2103) BP: (127-172)/(57-75) 172/75 mmHg (06/19 0700) SpO2:  [95 %-96 %] 96 % (06/19 0700) Weight change:  Last BM Date: 12/24/11  Intake/Output from previous day: 06/18 0701 - 06/19 0700 In: 525 [P.O.:325; IV Piggyback:200] Out: 1025 [Urine:1025] Intake/Output this shift:    General appearance: alert and cooperative Resp: clear to auscultation bilaterally GI: soft, non-tender; bowel sounds normal; no masses,  no organomegaly Extremities: extremities normal, atraumatic, no cyanosis or edema  Lab Results:  No results found for this or any previous visit (from the past 24 hour(s)).    Studies/Results: Ct Abdomen Pelvis Wo Contrast  12/24/2011   *RADIOLOGY REPORT*  Clinical Data: Nausea, vomiting and abdominal pain.  Back pain.  CT ABDOMEN AND PELVIS WITHOUT CONTRAST  Technique:  Multidetector CT imaging of the abdomen and pelvis was performed following the standard protocol without intravenous contrast.  Comparison: None  Findings: The lung bases demonstrate basilar scarring and right middle lobe atelectasis.  A small nodule is noted in the right middle lobe also.  The unenhanced appearance of the liver is grossly normal.  No focal lesions or intrahepatic biliary dilatation.  The spleen is normal in size.  No focal lesions.  The adrenal glands are normal.  No renal mass or hydronephrosis.  Vascular calcifications are noted involving both renal arteries.  The pancreas is markedly atrophied but no inflammatory changes or mass.  Common bile duct dilatation is noted the gallbladder is contracted.  The stomach,  duodenum and small bowel are grossly normal without oral contrast.  There is marked wall thickening and pericolonic inflammatory change involving the left colon beginning near the splenic flexure and extending all way down to the rectosigmoid junction area.  Findings likely due to an inflammatory or infectious process but ischemic colitis is also possible.  No pneumatosis or free air.  The bladder is normal.  The uterus is surgically absent.  No pelvic mass or significant free pelvic fluid collections.  The aorta demonstrates advanced atherosclerotic changes as do the branch vessels.  Scattered mesenteric and retroperitoneal lymph nodes.  Examination of the bony structures demonstrates osteoporotic changes.  Advanced degenerative changes noted in the spine along with a right convex scoliosis.  There is a T12 compression fracture which appears to be acute.  IMPRESSION:  1.  Marked inflammation of the left colon likely due to an inflammatory or infectious colitis.  Ischemic colitis is also possible. 2.  Common bile duct dilatation.  Recommend correlation with liver function studies.  If these are elevated ERCP or MRCP may be helpful for further evaluation. 3.  T12 compression fracture.  Original Report Authenticated By: P. Loralie Champagne, M.D.   Mr Thoracic Spine Wo Contrast  12/24/2011  *RADIOLOGY REPORT*  Clinical Data: Back pain.  Rule out fracture.  MRI THORACIC SPINE WITHOUT CONTRAST  Technique:  Multiplanar and multiecho pulse sequences of the thoracic spine were obtained without intravenous contrast.  Comparison: Thoracic radiographs 12/23/2011  Findings: Mild motion degrades image quality.  The patient did not complete the study.  Axial gradient images were not obtained but the remainder of the protocol was performed.  Mild  fracture of the superior vertebral endplate of T9 appears chronic without bone marrow edema.  Fracture of the superior endplate of T12 also appears chronic. Posterior vertebral spurring  is present at T11-T12 related to prior fracture and degenerative change.  No acute fracture.  Negative for metastatic disease.  Multiple hemangiomata are noted most notably at T8 and T10.  Spinal cord signal is normal.  No cord compression is identified.  Mild disc degeneration is present in the thoracic spine.  No significant disc protrusion or spinal stenosis is present.  IMPRESSION: Fractures of T9 and T12 appear chronic.  No acute fracture.  Mild degenerative changes.  Original Report Authenticated By: Camelia Phenes, M.D.    Medications:  Prior to Admission:  Prescriptions prior to admission  Medication Sig Dispense Refill  . amLODipine-benazepril (LOTREL) 10-40 MG per capsule Take 1 capsule by mouth every morning.      Marland Kitchen atenolol (TENORMIN) 50 MG tablet Take 50 mg by mouth every morning.      . butalbital-aspirin-caffeine (BUTALBITAL COMPOUND/ASA) 50-325-40 MG per capsule Take 1 capsule by mouth 2 (two) times daily.      . cholecalciferol (VITAMIN D) 1000 UNITS tablet Take 2,000 Units by mouth every morning.      . docusate sodium (COLACE) 50 MG capsule Take by mouth every morning.      . estrogens, conjugated, (PREMARIN) 0.9 MG tablet Take 0.9 mg by mouth every morning.      . feeding supplement (ENSURE IMMUNE HEALTH) LIQD Take 237 mLs by mouth 3 (three) times daily with meals.      . lidocaine (LIDODERM) 5 % Place onto the skin daily. Place 1/8th patch to buttocks daily at 8 am and remove at 8 pm.      . metroNIDAZOLE (METROGEL) 1 % gel Apply 1 application topically daily.      Marland Kitchen omeprazole (PRILOSEC) 20 MG capsule Take 20 mg by mouth every morning.      Marland Kitchen oxybutynin (DITROPAN-XL) 5 MG 24 hr tablet Take 5 mg by mouth every morning.      Marland Kitchen PARoxetine (PAXIL) 10 MG tablet Take 10 mg by mouth every morning.      . polyethylene glycol (MIRALAX / GLYCOLAX) packet Take 17 g by mouth daily as needed. For constipation.      Marland Kitchen zolpidem (AMBIEN) 5 MG tablet Take 5 mg by mouth at bedtime.        Scheduled:   . amLODipine  10 mg Oral Daily  . benazepril  40 mg Oral Daily  . ciprofloxacin  200 mg Intravenous Q24H  . feeding supplement  237 mL Oral q12n4p  . lidocaine  1 patch Transdermal Q24H  . metronidazole  500 mg Intravenous Q8H  . oxybutynin  5 mg Oral q morning - 10a  . pantoprazole  40 mg Oral Q1200  . PARoxetine  10 mg Oral q morning - 10a  . pneumococcal 23 valent vaccine  0.5 mL Intramuscular Tomorrow-1000  . sodium chloride  3 mL Intravenous Q12H   Continuous:   Assessment/Plan: Colitis, patient presenting with nausea vomiting abdominal pain leukocytosis. Currently on Cipro and Flagyl. We will change antibiotics to p.o. Hypertension stable Syncope on presentation probable vasovagal or orthostatic in etiology, no recurrence Chronic insomnia Chronic migraines Depression Thoracic spine compression fracture, T9 and T12. Chronic based on MRI. Patient without complaints. Disposition pending tolerance of p.o. Antibiotics.   LOS: 3 days   Theador Jezewski D 12/26/2011, 8:29 AM

## 2011-12-27 LAB — CBC
HCT: 32 % — ABNORMAL LOW (ref 36.0–46.0)
Hemoglobin: 10.8 g/dL — ABNORMAL LOW (ref 12.0–15.0)
MCHC: 33.8 g/dL (ref 30.0–36.0)
RBC: 3.31 MIL/uL — ABNORMAL LOW (ref 3.87–5.11)
WBC: 6.4 10*3/uL (ref 4.0–10.5)

## 2011-12-27 LAB — BASIC METABOLIC PANEL
BUN: 12 mg/dL (ref 6–23)
Chloride: 101 mEq/L (ref 96–112)
GFR calc Af Amer: 85 mL/min — ABNORMAL LOW (ref >90)
GFR calc non Af Amer: 73 mL/min — ABNORMAL LOW (ref >90)
Potassium: 4 mEq/L (ref 3.5–5.1)
Sodium: 135 mEq/L (ref 135–145)

## 2011-12-27 LAB — URINE CULTURE: Culture  Setup Time: 201306170343

## 2011-12-27 NOTE — Progress Notes (Signed)
Physical Therapy Treatment Patient Details Name: Carolyn Odonnell MRN: [id number] DOB: 1919/11/14 Today's Date: 12/27/2011 Time: 1010-1020 PT Time Calculation (min): 10 min  PT Assessment / Plan / Recommendation Comments on Treatment Session  Pt adm with syncope.  Pt ready for return to ALF from PT standpoint.    Follow Up Recommendations  Home health PT;Supervision - Intermittent    Barriers to Discharge        Equipment Recommendations  None recommended by PT    Recommendations for Other Services    Frequency Min 3X/week   Plan Discharge plan remains appropriate;Frequency remains appropriate    Precautions / Restrictions Precautions Precautions: Fall   Pertinent Vitals/Pain N/A    Mobility  Bed Mobility Bed Mobility: Sitting - Scoot to Edge of Bed Supine to Sit: 6: Modified independent (Device/Increase time);HOB elevated Sitting - Scoot to Edge of Bed: 6: Modified independent (Device/Increase time) Transfers Sit to Stand: 6: Modified independent (Device/Increase time);With upper extremity assist;From bed Stand to Sit: 6: Modified independent (Device/Increase time);With upper extremity assist;To chair/3-in-1;With armrests Details for Transfer Assistance: No cues needed. Ambulation/Gait Ambulation/Gait Assistance: 6: Modified independent (Device/Increase time) Ambulation Distance (Feet): 250 Feet Assistive device: 4-wheeled walker Ambulation/Gait Assistance Details: Steady gait Gait Pattern: Within Functional Limits    Exercises     PT Diagnosis:    PT Problem List:   PT Treatment Interventions:     PT Goals Acute Rehab PT Goals PT Goal: Sit to Stand - Progress: Met PT Goal: Ambulate - Progress: Met Additional Goals PT Goal: Additional Goal #1 - Progress: Progressing toward goal  Visit Information  Last PT Received On: 12/27/11 Assistance Needed: +1    Subjective Data  Subjective: "I am leaving today?"   Cognition  Overall Cognitive Status: Appears within  functional limits for tasks assessed/performed Arousal/Alertness: Awake/alert Orientation Level: Appears intact for tasks assessed Behavior During Session: [facility] for tasks performed    Balance     End of Session PT - End of Session Equipment Utilized During Treatment: Gait belt Activity Tolerance: Patient tolerated treatment well Patient left: in chair;with call bell/phone within reach Nurse Communication: Mobility status    Delmar Dondero 12/27/2011, 11:27 AM  Skip Mayer PT [phone]

## 2011-12-27 NOTE — Progress Notes (Signed)
Carolyn Odonnell to be D/C'd Assisted Living Facility per MD order.  Discussed with the patient the After Visit Summary and all questions fully answered. All IV's discontinued with no bleeding noted. All belongings returned to patient for patient to take home.   Susann Givens, RN, [facility] 12/27/2011 2:14 PM

## 2011-12-27 NOTE — Progress Notes (Signed)
Subjective: The patient is eating well and feeling well though appetite still somewhat suppressed. Afebrile and white count has improved and hemoglobin has improved. Ready for discharge back to assisted living  Objective: Weight change:   Intake/Output Summary (Last 24 hours) at 12/27/11 1330 Last data filed at 12/27/11 0900  Gross per 24 hour  Intake    238 ml  Output    600 ml  Net   -362 ml   Filed Vitals:   12/26/11 0955 12/26/11 1335 12/26/11 2019 12/27/11 0540  BP: 139/66 168/67 156/72 153/75  Pulse:  82 77 81  Temp:  97.8 F (36.6 C) 98 F (36.7 C) 98 F (36.7 C)  TempSrc:  Oral Oral Oral  Resp:  20 24 16   Height:      Weight:      SpO2:  98% 96% 97%   Gen.:   Elderly female in no acute distress with some mild hearing loss HEENT:  Atraumatic normocephalic with moist mucous membranes Neck:  Supple without JVD or masses Lungs:  Clear to auscultation Cardiac:  Regular rate and rhythm Abdomen:  Soft with only minimal tenderness in left lower quadrant to deep palpation Extremities: No edema or cyanosis Neurological: Oriented x3, nonfocal  Lab Results:  [facility] 12/27/11 0625  NA 135  K 4.0  CL 101  CO2 27  GLUCOSE 109*  BUN 12  CREATININE 0.72  CALCIUM 10.4  MG --  PHOS --   No results found for this basename: AST:2,ALT:2,ALKPHOS:2,BILITOT:2,PROT:2,ALBUMIN:2 in the last 72 hours No results found for this basename: LIPASE:2,AMYLASE:2 in the last 72 hours  Basename 12/27/11 0625 12/25/11 0500  WBC 6.4 10.0  NEUTROABS -- --  HGB 10.8* 9.3*  HCT 32.0* 27.1*  MCV 96.7 99.3  PLT 181 158   No results found for this basename: CKTOTAL:3,CKMB:3,CKMBINDEX:3,TROPONINI:3 in the last 72 hours No components found with this basename: POCBNP:3 No results found for this basename: DDIMER:2 in the last 72 hours No results found for this basename: HGBA1C:2 in the last 72 hours No results found for this basename: CHOL:2,HDL:2,LDLCALC:2,TRIG:2,CHOLHDL:2,LDLDIRECT:2 in the  last 72 hours No results found for this basename: TSH,T4TOTAL,FREET3,T3FREE,THYROIDAB in the last 72 hours No results found for this basename: VITAMINB12:2,FOLATE:2,FERRITIN:2,TIBC:2,IRON:2,RETICCTPCT:2 in the last 72 hours  Studies/Results: No results found. Medications: Scheduled Meds:   . amLODipine  10 mg Oral Daily  . benazepril  40 mg Oral Daily  . ciprofloxacin  250 mg Oral BID  . feeding supplement  237 mL Oral q12n4p  . lidocaine  1 patch Transdermal Q24H  . metroNIDAZOLE  250 mg Oral Q8H  . oxybutynin  5 mg Oral q morning - 10a  . pantoprazole  40 mg Oral Q1200  . PARoxetine  10 mg Oral q morning - 10a  . sodium chloride  3 mL Intravenous Q12H   Continuous Infusions:  PRN Meds:.acetaminophen, acetaminophen, ondansetron (ZOFRAN) IV, ondansetron, zolpidem  Assessment/Plan: Patient Active Problem List   Diagnosis Date Noted  . Syncope - likely secondary to nausea and vomiting and infection causing vagal response 12/23/2011  . Leucocytosis - improved, likely secondary to sigmoid colitis - continue antibiotics for one more week as above 12/23/2011  . Thoracic spine fracture 12/23/2011  . HTN (hypertension) 12/23/2011     LOS: 4 days   Carolyn Odonnell 12/27/2011, 1:30 PM

## 2011-12-27 NOTE — Progress Notes (Signed)
Clinical Social Worker facilitated pt discharge needs including contacting [facility], sending pt discharge information to [facility], confirming with facility that discharge information received and approved, meeting with pt and pt son and pt daughter-in-law to provide pt discharge packet. Pt plan to transport via pt son via private vehicle. No further social work needs identified at this time. Clinical Social Worker signing off.   Jacklynn Lewis, MSW, LCSWA  Clinical Social Work [phone]

## 2011-12-30 LAB — CULTURE, BLOOD (ROUTINE X 2): Culture: NO GROWTH

## 2012-10-16 ENCOUNTER — Inpatient Hospital Stay (HOSPITAL_COMMUNITY): Admission: RE | Admit: 2012-10-16 | Payer: No Typology Code available for payment source | Source: Ambulatory Visit

## 2012-10-16 ENCOUNTER — Ambulatory Visit (HOSPITAL_COMMUNITY): Admission: RE | Admit: 2012-10-16 | Payer: No Typology Code available for payment source | Source: Ambulatory Visit

## 2013-07-06 ENCOUNTER — Encounter (HOSPITAL_COMMUNITY): Payer: Self-pay | Admitting: Emergency Medicine

## 2013-07-06 ENCOUNTER — Emergency Department (HOSPITAL_COMMUNITY)
Admission: EM | Admit: 2013-07-06 | Discharge: 2013-07-07 | Disposition: A | Discharge status: Home / Self Care | Payer: Medicare Other | Attending: Emergency Medicine | Admitting: Emergency Medicine

## 2013-07-06 DIAGNOSIS — S01311A Laceration without foreign body of right ear, initial encounter: Secondary | ICD-10-CM

## 2013-07-06 DIAGNOSIS — Z882 Allergy status to sulfonamides status: Secondary | ICD-10-CM | POA: Insufficient documentation

## 2013-07-06 DIAGNOSIS — S01309A Unspecified open wound of unspecified ear, initial encounter: Secondary | ICD-10-CM | POA: Insufficient documentation

## 2013-07-06 DIAGNOSIS — I10 Essential (primary) hypertension: Secondary | ICD-10-CM | POA: Insufficient documentation

## 2013-07-06 DIAGNOSIS — Y9389 Activity, other specified: Secondary | ICD-10-CM | POA: Insufficient documentation

## 2013-07-06 DIAGNOSIS — Z79899 Other long term (current) drug therapy: Secondary | ICD-10-CM | POA: Insufficient documentation

## 2013-07-06 DIAGNOSIS — F3289 Other specified depressive episodes: Secondary | ICD-10-CM | POA: Insufficient documentation

## 2013-07-06 DIAGNOSIS — S62309A Unspecified fracture of unspecified metacarpal bone, initial encounter for closed fracture: Secondary | ICD-10-CM

## 2013-07-06 DIAGNOSIS — Z885 Allergy status to narcotic agent status: Secondary | ICD-10-CM | POA: Insufficient documentation

## 2013-07-06 DIAGNOSIS — S62319A Displaced fracture of base of unspecified metacarpal bone, initial encounter for closed fracture: Secondary | ICD-10-CM | POA: Insufficient documentation

## 2013-07-06 DIAGNOSIS — R001 Bradycardia, unspecified: Secondary | ICD-10-CM

## 2013-07-06 DIAGNOSIS — I498 Other specified cardiac arrhythmias: Secondary | ICD-10-CM | POA: Insufficient documentation

## 2013-07-06 DIAGNOSIS — F329 Major depressive disorder, single episode, unspecified: Secondary | ICD-10-CM | POA: Insufficient documentation

## 2013-07-06 DIAGNOSIS — W07XXXA Fall from chair, initial encounter: Secondary | ICD-10-CM | POA: Insufficient documentation

## 2013-07-06 DIAGNOSIS — Z888 Allergy status to other drugs, medicaments and biological substances status: Secondary | ICD-10-CM | POA: Insufficient documentation

## 2013-07-06 DIAGNOSIS — Y9210 Unspecified residential institution as the place of occurrence of the external cause: Secondary | ICD-10-CM | POA: Insufficient documentation

## 2013-07-06 DIAGNOSIS — M129 Arthropathy, unspecified: Secondary | ICD-10-CM | POA: Insufficient documentation

## 2013-07-06 DIAGNOSIS — F411 Generalized anxiety disorder: Secondary | ICD-10-CM | POA: Insufficient documentation

## 2013-07-06 DIAGNOSIS — Z880 Allergy status to penicillin: Secondary | ICD-10-CM | POA: Insufficient documentation

## 2013-07-06 MED ORDER — ACETAMINOPHEN 500 MG PO TABS
1000.0000 mg | ORAL_TABLET | Freq: Once | ORAL | Status: AC
  Administered 2013-07-06: 1000 mg via ORAL
  Filled 2013-07-06: qty 2

## 2013-07-06 NOTE — ED Notes (Signed)
Pts wounds cleaned.  

## 2013-07-06 NOTE — ED Provider Notes (Signed)
CSN: [id number]     Arrival date & time 07/06/13  2259 History   First MD Initiated Contact with Patient 07/06/13 2302     Chief Complaint  Patient presents with  . Fall   (Consider location/radiation/quality/duration/timing/severity/associated sxs/prior Treatment) HPI Comments: 79 year old female presents from assisted care facility shortly after she took half of her nighttime sleeping medication, tried to get out of her chair to get some food and fell forward landing on her hand. She complains of acute onset of right hand pain with associated swelling. Worse with palpation and range of motion and associated with a small laceration to the right ear. She denies loss of consciousness or headache, has no change in vision, no weakness numbness difficulty urinating diarrhea or loss of appetite. She has no chest pain or shortness of breath and prior to this event was having no complaints. Paramedics transported the patient with a hematoma noted to the right dorsal hand. The patient does not take anticoagulation  Patient is a 79 y.o. female presenting with fall. The history is provided by the patient.  Fall    Past Medical History  Diagnosis Date  . Hypertension   . Arthritis   . Depression   . Anxiety    Past Surgical History  Procedure Laterality Date  . Colon surgery    . Abdominal hysterectomy    . Cholecystectomy     No family history on file. History  Substance Use Topics  . Smoking status: Never Smoker   . Smokeless tobacco: Not on file  . Alcohol Use: No   OB History   Grav Para Term Preterm Abortions TAB SAB Ect Mult Living                 Review of Systems  All other systems reviewed and are negative.    Allergies  Codeine; Doxycycline; Fosamax; Morphine and related; Novocain; Penicillins; Percocet; Skelaxin; and Sulfa antibiotics  Home Medications   Current Outpatient Rx  Name  Route  Sig  Dispense  Refill  . amLODipine-benazepril (LOTREL) 10-40 MG per  capsule   Oral   Take 1 capsule by mouth every morning.         Marland Kitchen atenolol (TENORMIN) 50 MG tablet   Oral   Take 50 mg by mouth every morning.         . butalbital-aspirin-caffeine (BUTALBITAL COMPOUND/ASA) 50-325-40 MG per capsule   Oral   Take 1 capsule by mouth 2 (two) times daily.         . cholecalciferol (VITAMIN D) 1000 UNITS tablet   Oral   Take 2,000 Units by mouth every morning.         . Diphenhyd-Hydrocort-Nystatin (FIRST-DUKES MOUTHWASH MT)   Mouth/Throat   Use as directed 30 mLs in the mouth or throat every 6 (six) hours as needed (sore throat).         Marland Kitchen docusate sodium (COLACE) 50 MG capsule   Oral   Take by mouth every morning.         . estrogens, conjugated, (PREMARIN) 0.9 MG tablet   Oral   Take 0.9 mg by mouth every morning.         . feeding supplement (ENSURE IMMUNE HEALTH) LIQD   Oral   Take 237 mLs by mouth 3 (three) times daily with meals.         Marland Kitchen guaiFENesin (MUCINEX) 600 MG 12 hr tablet   Oral   Take 600 mg by mouth 2 (two) times  daily as needed for to loosen phlegm.         . metroNIDAZOLE (METROGEL) 1 % gel   Topical   Apply 1 application topically daily.         Marland Kitchen omeprazole (PRILOSEC) 20 MG capsule   Oral   Take 20 mg by mouth every morning.         Marland Kitchen oxybutynin (DITROPAN-XL) 5 MG 24 hr tablet   Oral   Take 5 mg by mouth every morning.         . polyethylene glycol (MIRALAX / GLYCOLAX) packet   Oral   Take 17 g by mouth daily as needed. For constipation.         Marland Kitchen zolpidem (AMBIEN) 5 MG tablet   Oral   Take 5 mg by mouth at bedtime.         . lidocaine (LIDODERM) 5 %   Transdermal   Place onto the skin daily. Place 1/8th patch to buttocks daily at 8 am and remove at 8 pm.         . naproxen (NAPROSYN) 500 MG tablet   Oral   Take 1 tablet (500 mg total) by mouth 2 (two) times daily with a meal.   30 tablet   0   . PARoxetine (PAXIL) 10 MG tablet   Oral   Take 10 mg by mouth every  morning.          BP 137/62  Pulse 47  Temp(Src) 98.4 F (36.9 C) (Oral)  Resp 16  SpO2 99% Physical Exam  Nursing note and vitals reviewed. Constitutional: She appears well-developed and well-nourished. No distress.  HENT:  Head: Normocephalic.  Mouth/Throat: Oropharynx is clear and moist. No oropharyngeal exudate.  Small laceration to the right auricle, bleeding controlled  Eyes: Conjunctivae and EOM are normal. Pupils are equal, round, and reactive to light. Right eye exhibits no discharge. Left eye exhibits no discharge. No scleral icterus.  Neck: Normal range of motion. Neck supple. No JVD present. No thyromegaly present.  Cardiovascular: Regular rhythm, normal heart sounds and intact distal pulses.  Exam reveals no gallop and no friction rub.   No murmur heard. Bradycardic, pulse of 45, strong pulses at the radial arteries  Pulmonary/Chest: Effort normal and breath sounds normal. No respiratory distress. She has no wheezes. She has no rales.  Abdominal: Soft. Bowel sounds are normal. She exhibits no distension and no mass. There is no tenderness.  Musculoskeletal: She exhibits tenderness. She exhibits no edema.  Swelling of the right hand, hematoma bruising and deformity to the dorsum of the hand over the second and third distal metacarpals, weak grip secondary to pain. Normal range of motion of the wrist. No deformity, dislocation, tenderness or abnormal appearance to the left upper extremity or the bilateral lower extremities, no tenderness over the spine  Lymphadenopathy:    She has no cervical adenopathy.  Neurological: She is alert. Coordination normal.  Moving all extremities, normal strength and sensation of all 4 extremities, cranial nerves III through XII appear intact, speech is clear  Skin: Skin is warm and dry. No rash noted. No erythema.  Small laceration to the right ear, hematoma to the dorsum of the right hand  Psychiatric: She has a normal mood and affect. Her  behavior is normal.    ED Course  Procedures (including critical care time) Labs Review Labs Reviewed - No data to display Imaging Review Dg Wrist Complete Right  07/07/2013   CLINICAL DATA:  Patient fell forward tonight tried to catch himself with right wrist pain.  EXAM: RIGHT WRIST - COMPLETE 3+ VIEW  COMPARISON:  None.  FINDINGS: The findings seen on the right hand x-ray are not well appreciated on the wrist x-ray. There is no wrist fracture or dislocation.  There is calcification of the triangular fibrocartilage complex. There is severe degenerative joint disease of the 1st IP joint and 2nd DIP joint. There are moderate degenerative changes of the 4th and 5th DIP joints. The soft tissues are normal.  IMPRESSION: The findings seen on the right hand x-ray are not well appreciated on the wrist x-ray. There is no right wrist fracture or dislocation.   Electronically Signed   By: Elige Ko   On: 07/07/2013 00:59   Dg Hand Complete Right  07/07/2013   CLINICAL DATA:  Status post fall with right hand pain  EXAM: RIGHT HAND - COMPLETE 3+ VIEW  COMPARISON:  None.  FINDINGS: There is severe osteopenia. There is a subtle linear lucency through the proximal dorsal base of the 3rd or 4th metacarpal on the lateral view with a fracture fragment along the volar aspect of the bases of the metacarpals concerning for a fracture. It is difficult to delineate which metacarpal base is involved.  There is calcification of the triangular fibrocartilage complex. There is severe degenerative joint disease of the 1st IP joint and 2nd DIP joint. There are moderate degenerative changes of the 4th and 5th DIP joints. The soft tissues are normal.  IMPRESSION: There is a subtle linear lucency through the proximal dorsal base of the 3rd or 4th metacarpal on the lateral view with a fracture fragment along the volar aspect of the bases of the metacarpals concerning for a fracture. It is difficult to delineate which metacarpal  base is involved.   Electronically Signed   By: Elige Ko   On: 07/07/2013 00:57    EKG Interpretation    Date/Time:  Monday July 06 2013 23:35:39 EST Ventricular Rate:  48 PR Interval:  182 QRS Duration: 86 QT Interval:  444 QTC Calculation: 397 R Axis:   -37 Text Interpretation:  Sinus bradycardia Left axis deviation Anteroseptal infarct, age indeterminate Confirmed by Alvaretta Eisenberger  MD, Quentez Lober ([id number]) on 07/07/2013 5:10:09 AM            MDM   1. Metacarpal bone fracture, closed, initial encounter   2. Laceration of right ear   3. Bradycardia    The patient does not seem to have any complaints related to infectious or metabolic nature. She has no chest pain or shortness of breath but is bradycardic. Her fall seems to be related to lightheadedness after taking a sleeping pill however will obtain an EKG to rule out other source. Imaging to rule out injuries.  LACERATION REPAIR Performed by: Vida Roller Authorized by: Vida Roller Consent: Verbal consent obtained. Risks and benefits: risks, benefits and alternatives were discussed Consent given by: patient Patient identity confirmed: provided demographic data Prepped and Draped in normal sterile fashion Wound explored  Laceration Location: Right ear  Laceration Length: 2.5 cm  No Foreign Bodies seen or palpated  Anesthesia: local infiltration  Local anesthetic: lidocaine 1 % without epinephrine  Anesthetic total: 1 ml  Irrigation method: syringe Amount of cleaning: standard  Skin closure: 6-0 Prolene   Number of sutures: 4   Technique: Simple interrupted   Patient tolerance: Patient tolerated the procedure well with no immediate complications.     Vida Roller, MD  07/07/13 0510 

## 2013-07-06 NOTE — ED Notes (Signed)
Per EMS, pt took her [employer], stayed away a little while and then decided to get a snack and fell in the kitchen.  Hematoma noted to right wrist and laceration to right ear noted.

## 2013-07-07 ENCOUNTER — Emergency Department (HOSPITAL_COMMUNITY): Payer: Medicare Other

## 2013-07-07 MED ORDER — NAPROXEN 500 MG PO TABS: 500.0000 mg | ORAL_TABLET | Freq: Two times a day (BID) | ORAL | Status: DC

## 2013-07-07 NOTE — ED Notes (Signed)
Report called to [facility]. -

## 2013-07-07 NOTE — Progress Notes (Signed)
Orthopedic Tech Progress Note Patient Details:  Carolyn Odonnell [birth year] [id number]  Ortho Devices Type of Ortho Device: Volar splint   Haskell Flirt 07/07/2013, 2:14 AM

## 2013-07-07 NOTE — Progress Notes (Signed)
Orthopedic Tech Progress Note Patient Details:  Carolyn Odonnell 11/30/1919 [id number]  Ortho Devices Type of Ortho Device: Volar splint   Carolan Avedisian M 07/07/2013, 2:14 AM  

## 2015-02-04 ENCOUNTER — Emergency Department (HOSPITAL_COMMUNITY)
Admission: EM | Admit: 2015-02-04 | Discharge: 2015-02-05 | Disposition: A | Discharge status: Home / Self Care | Payer: Medicare Other | Attending: Emergency Medicine | Admitting: Emergency Medicine

## 2015-02-04 ENCOUNTER — Encounter (HOSPITAL_COMMUNITY): Payer: Self-pay | Admitting: *Deleted

## 2015-02-04 ENCOUNTER — Emergency Department (HOSPITAL_COMMUNITY): Payer: Medicare Other

## 2015-02-04 DIAGNOSIS — Y998 Other external cause status: Secondary | ICD-10-CM | POA: Diagnosis not present

## 2015-02-04 DIAGNOSIS — Y9289 Other specified places as the place of occurrence of the external cause: Secondary | ICD-10-CM | POA: Insufficient documentation

## 2015-02-04 DIAGNOSIS — I10 Essential (primary) hypertension: Secondary | ICD-10-CM | POA: Diagnosis not present

## 2015-02-04 DIAGNOSIS — K219 Gastro-esophageal reflux disease without esophagitis: Secondary | ICD-10-CM | POA: Insufficient documentation

## 2015-02-04 DIAGNOSIS — K5900 Constipation, unspecified: Secondary | ICD-10-CM | POA: Insufficient documentation

## 2015-02-04 DIAGNOSIS — Z792 Long term (current) use of antibiotics: Secondary | ICD-10-CM | POA: Diagnosis not present

## 2015-02-04 DIAGNOSIS — W19XXXA Unspecified fall, initial encounter: Secondary | ICD-10-CM

## 2015-02-04 DIAGNOSIS — Z79899 Other long term (current) drug therapy: Secondary | ICD-10-CM | POA: Insufficient documentation

## 2015-02-04 DIAGNOSIS — S0003XA Contusion of scalp, initial encounter: Secondary | ICD-10-CM | POA: Diagnosis not present

## 2015-02-04 DIAGNOSIS — Z880 Allergy status to penicillin: Secondary | ICD-10-CM | POA: Diagnosis not present

## 2015-02-04 DIAGNOSIS — Y9301 Activity, walking, marching and hiking: Secondary | ICD-10-CM | POA: Diagnosis not present

## 2015-02-04 DIAGNOSIS — Z791 Long term (current) use of non-steroidal anti-inflammatories (NSAID): Secondary | ICD-10-CM | POA: Diagnosis not present

## 2015-02-04 DIAGNOSIS — S0990XA Unspecified injury of head, initial encounter: Secondary | ICD-10-CM | POA: Diagnosis present

## 2015-02-04 DIAGNOSIS — W01198A Fall on same level from slipping, tripping and stumbling with subsequent striking against other object, initial encounter: Secondary | ICD-10-CM | POA: Diagnosis not present

## 2015-02-04 DIAGNOSIS — F419 Anxiety disorder, unspecified: Secondary | ICD-10-CM | POA: Diagnosis not present

## 2015-02-04 DIAGNOSIS — F329 Major depressive disorder, single episode, unspecified: Secondary | ICD-10-CM | POA: Insufficient documentation

## 2015-02-04 DIAGNOSIS — M1990 Unspecified osteoarthritis, unspecified site: Secondary | ICD-10-CM | POA: Insufficient documentation

## 2015-02-04 HISTORY — DX: Headache, unspecified: R51.9

## 2015-02-04 HISTORY — DX: Headache: R51

## 2015-02-04 HISTORY — DX: Age-related osteoporosis without current pathological fracture: M81.0

## 2015-02-04 HISTORY — DX: Constipation, unspecified: K59.00

## 2015-02-04 HISTORY — DX: Gastro-esophageal reflux disease without esophagitis: K21.9

## 2015-02-04 NOTE — ED Notes (Signed)
MD at bedside. 

## 2015-02-04 NOTE — ED Notes (Signed)
EDP at bedside  

## 2015-02-04 NOTE — ED Notes (Signed)
Patient transported to CT 

## 2015-02-04 NOTE — ED Provider Notes (Signed)
CSN: [id number]     Arrival date & time 02/04/15  2157 History   First MD Initiated Contact with Patient 02/04/15 2205     Chief Complaint  Patient presents with  . Fall     (Consider location/radiation/quality/duration/timing/severity/associated sxs/prior Treatment) HPI Comments: 81 yo female presenting after a fall.  She reports that she fell because she didn't use her walker.  She hit her head on a bedside table.  She did not lose consciousness.    Patient is a 81 y.o. female presenting with fall.  Fall This is a recurrent problem. The current episode started less than 1 hour ago. Episode frequency: once. The problem has been resolved. Associated symptoms include headaches. Pertinent negatives include no chest pain, no abdominal pain and no shortness of breath. Associated symptoms comments: Scalp hematoma. No LOC.. Nothing aggravates the symptoms. Nothing relieves the symptoms.    Past Medical History  Diagnosis Date  . Hypertension   . Arthritis   . Depression   . Anxiety   . GERD (gastroesophageal reflux disease)   . Constipation   . Chronic headache   . Osteoporosis    Past Surgical History  Procedure Laterality Date  . Colon surgery    . Abdominal hysterectomy    . Cholecystectomy     History reviewed. No pertinent family history. History  Substance Use Topics  . Smoking status: Never Smoker   . Smokeless tobacco: Not on file  . Alcohol Use: No   OB History    No data available     Review of Systems  Respiratory: Negative for shortness of breath.   Cardiovascular: Negative for chest pain.  Gastrointestinal: Negative for abdominal pain.  Neurological: Positive for headaches.  All other systems reviewed and are negative.     Allergies  Zolpidem; Codeine; Doxycycline; Fosamax; Morphine and related; Novocain; Penicillins; Percocet; Skelaxin; and Sulfa antibiotics  Home Medications   Prior to Admission medications   Medication Sig Start Date End Date  Taking? Authorizing Provider  amLODipine-benazepril (LOTREL) 10-40 MG per capsule Take 1 capsule by mouth every morning.   Yes Historical Provider, MD  atenolol (TENORMIN) 50 MG tablet Take 25 mg by mouth every morning.    Yes Historical Provider, MD  butalbital-aspirin-caffeine (BUTALBITAL COMPOUND/ASA) 50-325-40 MG per capsule Take 1 capsule by mouth 2 (two) times daily.   Yes Historical Provider, MD  cholecalciferol (VITAMIN D) 1000 UNITS tablet Take 2,000 Units by mouth every morning.   Yes Historical Provider, MD  Diphenhyd-Hydrocort-Nystatin (FIRST-DUKES MOUTHWASH MT) Use as directed 30 mLs in the mouth or throat every 6 (six) hours as needed (sore throat).   Yes Historical Provider, MD  docusate sodium (COLACE) 50 MG capsule Take 50 mg by mouth daily as needed for moderate constipation.    Yes Historical Provider, MD  ENSURE (ENSURE) Take 237 mLs by mouth 3 (three) times daily between meals.   Yes Historical Provider, MD  estrogens, conjugated, (PREMARIN) 0.9 MG tablet Take 0.9 mg by mouth every morning.   Yes Historical Provider, MD  guaiFENesin (MUCINEX) 600 MG 12 hr tablet Take 600 mg by mouth 2 (two) times daily as needed for to loosen phlegm.   Yes Historical Provider, MD  metroNIDAZOLE (METROGEL) 1 % gel Apply 1 application topically daily.   Yes Historical Provider, MD  omeprazole (PRILOSEC) 20 MG capsule Take 20 mg by mouth every morning.   Yes Historical Provider, MD  oxybutynin (DITROPAN-XL) 5 MG 24 hr tablet Take 5 mg by mouth every  morning.   Yes Historical Provider, MD  PARoxetine (PAXIL) 40 MG tablet Take 40 mg by mouth daily. 02/04/15  Yes Historical Provider, MD  polyethylene glycol (MIRALAX / GLYCOLAX) packet Take 17 g by mouth daily. For constipation.   Yes Historical Provider, MD  naproxen (NAPROSYN) 500 MG tablet Take 1 tablet (500 mg total) by mouth 2 (two) times daily with a meal. 07/07/13   Noemi Chapel, MD   BP 151/60 mmHg  Pulse 56  Temp(Src) 98.2 F (36.8 C) (Oral)   Resp 16  SpO2 98% Physical Exam  Constitutional: She is oriented to person, place, and time. She appears well-developed and well-nourished. No distress.  HENT:  Head: Normocephalic and atraumatic.    Mouth/Throat: Oropharynx is clear and moist.  Eyes: Conjunctivae are normal. Pupils are equal, round, and reactive to light. No scleral icterus.  Neck: Neck supple.  Cardiovascular: Normal rate, regular rhythm, normal heart sounds and intact distal pulses.   No murmur heard. Pulmonary/Chest: Effort normal and breath sounds normal. No stridor. No respiratory distress. She has no rales.  Abdominal: Soft. Bowel sounds are normal. She exhibits no distension. There is no tenderness.  Musculoskeletal: Normal range of motion.  Neurological: She is alert and oriented to person, place, and time. GCS eye subscore is 4. GCS verbal subscore is 5. GCS motor subscore is 6.  Skin: Skin is warm and dry. No rash noted.  Psychiatric: She has a normal mood and affect. Her behavior is normal.  Nursing note and vitals reviewed.   ED Course  Procedures (including critical care time) Labs Review Labs Reviewed - No data to display  Imaging Review Ct Head Wo Contrast  02/04/2015   CLINICAL DATA:  81 year old female post fall, hitting back of head. Laceration. Neck stiffness.  EXAM: CT HEAD WITHOUT CONTRAST  CT CERVICAL SPINE WITHOUT CONTRAST  TECHNIQUE: Multidetector CT imaging of the head and cervical spine was performed following the standard protocol without intravenous contrast. Multiplanar CT image reconstructions of the cervical spine were also generated.  COMPARISON:  Head CT 12/23/2011  FINDINGS: CT HEAD FINDINGS  Small right parietal occipital scalp hematoma, no associated fracture. No intracranial hemorrhage, mass effect, or midline shift. Moderate atrophy and chronic small vessel ischemic change, stable in degree from prior exam. No hydrocephalus. The basilar cisterns are patent. No evidence of  territorial infarct. No intracranial fluid collection. Atherosclerosis of the skullbase vasculature. Calvarium is intact. Included paranasal sinuses and mastoid air cells are well aerated.  CT CERVICAL SPINE FINDINGS  No acute fracture. Extensive multilevel degenerative change throughout the entire cervical spine. There is anterolisthesis of C3 on C4, C4 on C5, and to a lesser extent C7 on T1. Disc space narrowing from C3-C4 through C6-C7 with associated endplate spurs. Large pannus about the odontoid. Multilevel facet arthropathy. There is multilevel neural foraminal and spinal canal stenosis related to degenerative change. No compression deformity. No prevertebral soft tissue edema. Dense atherosclerosis of the carotid arteries.  IMPRESSION: 1. Posterior scalp hematoma without calvarial fracture or intracranial hemorrhage. Atrophy and chronic small vessel ischemic change, stable from prior. 2. Extensive multilevel degenerative change throughout the cervical spine without acute fracture.   Electronically Signed   By: Jeb Levering M.D.   On: 02/04/2015 23:14   Ct Cervical Spine Wo Contrast  02/04/2015   CLINICAL DATA:  81 year old female post fall, hitting back of head. Laceration. Neck stiffness.  EXAM: CT HEAD WITHOUT CONTRAST  CT CERVICAL SPINE WITHOUT CONTRAST  TECHNIQUE: Multidetector CT imaging  of the head and cervical spine was performed following the standard protocol without intravenous contrast. Multiplanar CT image reconstructions of the cervical spine were also generated.  COMPARISON:  Head CT 12/23/2011  FINDINGS: CT HEAD FINDINGS  Small right parietal occipital scalp hematoma, no associated fracture. No intracranial hemorrhage, mass effect, or midline shift. Moderate atrophy and chronic small vessel ischemic change, stable in degree from prior exam. No hydrocephalus. The basilar cisterns are patent. No evidence of territorial infarct. No intracranial fluid collection. Atherosclerosis of the  skullbase vasculature. Calvarium is intact. Included paranasal sinuses and mastoid air cells are well aerated.  CT CERVICAL SPINE FINDINGS  No acute fracture. Extensive multilevel degenerative change throughout the entire cervical spine. There is anterolisthesis of C3 on C4, C4 on C5, and to a lesser extent C7 on T1. Disc space narrowing from C3-C4 through C6-C7 with associated endplate spurs. Large pannus about the odontoid. Multilevel facet arthropathy. There is multilevel neural foraminal and spinal canal stenosis related to degenerative change. No compression deformity. No prevertebral soft tissue edema. Dense atherosclerosis of the carotid arteries.  IMPRESSION: 1. Posterior scalp hematoma without calvarial fracture or intracranial hemorrhage. Atrophy and chronic small vessel ischemic change, stable from prior. 2. Extensive multilevel degenerative change throughout the cervical spine without acute fracture.   Electronically Signed   By: Jeb Levering M.D.   On: 02/04/2015 23:14     EKG Interpretation   Date/Time:  Friday February 04 2015 22:08:06 EDT Ventricular Rate:  102 PR Interval:  46 QRS Duration: 98 QT Interval:  415 QTC Calculation: 541 R Axis:   -11 Text Interpretation:  Normal sinus rhythm Artifact Low voltage, extremity  and precordial leads Anteroseptal infarct, old Nonspecific repol  abnormality, lateral leads Prolonged QT interval Confirmed by [facility]  MD,  TREY ([id number]) on 02/04/2015 11:17:32 PM      MDM   Final diagnoses:  Fall  Scalp contusion, initial encounter    Well appearing 81 yo female presenting after a fall.  Struck head.  No discrete laceration to repair. CT head and C spine negative.  No other acute injuries and she is alert and oriented.  Able to ambulate at baseline.  DC home.      Serita Grit, MD 02/05/15 0000

## 2015-02-04 NOTE — ED Notes (Signed)
EMS-pt is from [facility], states she tried to walk without her walker and lost balance falling backwards and striking her head. EMS noted small laceration to the back of her head. Dressing applied. C-Collar in place, pt complained of neck stiffness en route.

## 2015-02-04 NOTE — ED Notes (Signed)
No bleeding noted to the back of head

## 2015-02-04 NOTE — Discharge Instructions (Signed)

## 2015-02-05 NOTE — ED Notes (Addendum)
Pt able to stand at bedside with assistance; Pt has no complaints at this times; states she feel normal

## 2015-02-05 NOTE — ED Notes (Signed)
Pt verbalized understanding of d/c instructions as well as the pt's son. Pt stable and in NAD. Pt discharged with PTAR.

## 2016-05-05 ENCOUNTER — Encounter (HOSPITAL_COMMUNITY): Payer: Self-pay | Admitting: Emergency Medicine

## 2016-05-05 ENCOUNTER — Emergency Department (HOSPITAL_COMMUNITY)
Admission: EM | Admit: 2016-05-05 | Discharge: 2016-05-06 | Disposition: A | Discharge status: Home / Self Care | Payer: Medicare Other | Attending: Emergency Medicine | Admitting: Emergency Medicine

## 2016-05-05 DIAGNOSIS — D649 Anemia, unspecified: Secondary | ICD-10-CM | POA: Insufficient documentation

## 2016-05-05 DIAGNOSIS — I10 Essential (primary) hypertension: Secondary | ICD-10-CM | POA: Insufficient documentation

## 2016-05-05 DIAGNOSIS — Z79899 Other long term (current) drug therapy: Secondary | ICD-10-CM | POA: Diagnosis not present

## 2016-05-05 LAB — TYPE AND SCREEN
ABO/RH(D): AB POS
Antibody Screen: NEGATIVE

## 2016-05-05 LAB — CBC WITH DIFFERENTIAL/PLATELET
Basophils Absolute: 0 10*3/uL (ref 0.0–0.1)
Basophils Relative: 0 %
Eosinophils Absolute: 0.1 10*3/uL (ref 0.0–0.7)
Eosinophils Relative: 1 %
HCT: 22.4 % — ABNORMAL LOW (ref 36.0–46.0)
Hemoglobin: 7.1 g/dL — ABNORMAL LOW (ref 12.0–15.0)
Lymphocytes Relative: 38 %
Lymphs Abs: 3.8 10*3/uL (ref 0.7–4.0)
MCH: 36 pg — ABNORMAL HIGH (ref 26.0–34.0)
MCHC: 31.7 g/dL (ref 30.0–36.0)
MCV: 113.7 fL — ABNORMAL HIGH (ref 78.0–100.0)
MONO ABS: 1.8 10*3/uL — AB (ref 0.1–1.0)
MONOS PCT: 18 %
Neutro Abs: 4.4 10*3/uL (ref 1.7–7.7)
Neutrophils Relative %: 43 %
Platelets: 78 10*3/uL — ABNORMAL LOW (ref 150–400)
RBC: 1.97 MIL/uL — AB (ref 3.87–5.11)
RDW: 18.1 % — AB (ref 11.5–15.5)
WBC Morphology: INCREASED
WBC: 10.1 10*3/uL (ref 4.0–10.5)

## 2016-05-05 LAB — COMPREHENSIVE METABOLIC PANEL
ALBUMIN: 4.3 g/dL (ref 3.5–5.0)
ALT: 15 U/L (ref 14–54)
ANION GAP: 7 (ref 5–15)
AST: 21 U/L (ref 15–41)
Alkaline Phosphatase: 93 U/L (ref 38–126)
BUN: 41 mg/dL — ABNORMAL HIGH (ref 6–20)
CHLORIDE: 111 mmol/L (ref 101–111)
CO2: 22 mmol/L (ref 22–32)
Calcium: 10.6 mg/dL — ABNORMAL HIGH (ref 8.9–10.3)
Creatinine, Ser: 1.15 mg/dL — ABNORMAL HIGH (ref 0.44–1.00)
GFR calc Af Amer: 46 mL/min — ABNORMAL LOW (ref >60)
GFR calc non Af Amer: 39 mL/min — ABNORMAL LOW (ref >60)
Glucose, Bld: 100 mg/dL — ABNORMAL HIGH (ref 65–99)
POTASSIUM: 4.6 mmol/L (ref 3.5–5.1)
SODIUM: 140 mmol/L (ref 135–145)
Total Bilirubin: 1.4 mg/dL — ABNORMAL HIGH (ref 0.3–1.2)
Total Protein: 7.4 g/dL (ref 6.5–8.1)

## 2016-05-05 LAB — ABO/RH: ABO/RH(D): AB POS

## 2016-05-05 NOTE — Discharge Instructions (Signed)
Your hemoglobin was 7.1 and your platelet count was 78 today.  Please follow up with your family doctor to recheck your blood counts.  Get rechecked immediately if you develop difficulty breathing, dizziness, bloody stools, or new concerning symptoms.

## 2016-05-05 NOTE — ED Triage Notes (Signed)
Pt sent from nursing home, nursing home reports hgb of 6.0, no labs sent with patient. Pt asymptomatic. Pt denies bleeding / blood in stools, pt alert and oriented.

## 2016-05-05 NOTE — ED Notes (Signed)
Bed: WA09 Expected date:  Expected time:  Means of arrival:  Comments: Low HGB

## 2016-05-05 NOTE — ED Notes (Signed)
Bed: WHALD Expected date:  Expected time:  Means of arrival:  Comments: 

## 2016-05-06 NOTE — ED Provider Notes (Signed)
[facility] DEPT Provider Note   CSN: [id number] Arrival date & time: 05/05/16  1453     History   Chief Complaint Chief Complaint  Patient presents with  . low hemoglobin    HPI Carolyn Odonnell is a 82 y.o. female.  The history is provided by the patient and a relative. No language interpreter was used.   Carolyn Odonnell is a 82 y.o. female who presents to the Emergency Department complaining of low hemoglobin.  She was sent from assisted living for a hemoglobin of 6 on lab draw today.  She denies any chest pain, sob, abdominal pain, bloody stools, dizziness.  The actual lab result is not available.  She states that her labs have been trending down but does not know what they have been.   Past Medical History:  Diagnosis Date  . Anxiety   . Arthritis   . Chronic headache   . Constipation   . Depression   . GERD (gastroesophageal reflux disease)   . Hypertension   . Osteoporosis     Patient Active Problem List   Diagnosis Date Noted  . Syncope 12/23/2011  . Leucocytosis 12/23/2011  . Thoracic spine fracture ([place]) 12/23/2011  . HTN (hypertension) 12/23/2011    Past Surgical History:  Procedure Laterality Date  . ABDOMINAL HYSTERECTOMY    . CHOLECYSTECTOMY    . COLON SURGERY      OB History    No data available       Home Medications    Prior to Admission medications   Medication Sig Start Date End Date Taking? Authorizing Provider  amLODipine-benazepril (LOTREL) 10-40 MG per capsule Take 1 capsule by mouth every morning.    Historical Provider, MD  atenolol (TENORMIN) 50 MG tablet Take 25 mg by mouth every morning.     Historical Provider, MD  butalbital-aspirin-caffeine (BUTALBITAL COMPOUND/ASA) 50-325-40 MG per capsule Take 1 capsule by mouth 2 (two) times daily.    Historical Provider, MD  cholecalciferol (VITAMIN D) 1000 UNITS tablet Take 2,000 Units by mouth every morning.    Historical Provider, MD  Diphenhyd-Hydrocort-Nystatin (FIRST-DUKES MOUTHWASH  MT) Use as directed 30 mLs in the mouth or throat every 6 (six) hours as needed (sore throat).    Historical Provider, MD  docusate sodium (COLACE) 50 MG capsule Take 50 mg by mouth daily as needed for moderate constipation.     Historical Provider, MD  ENSURE (ENSURE) Take 237 mLs by mouth 3 (three) times daily between meals.    Historical Provider, MD  estrogens, conjugated, (PREMARIN) 0.9 MG tablet Take 0.9 mg by mouth every morning.    Historical Provider, MD  guaiFENesin (MUCINEX) 600 MG 12 hr tablet Take 600 mg by mouth 2 (two) times daily as needed for to loosen phlegm.    Historical Provider, MD  metroNIDAZOLE (METROGEL) 1 % gel Apply 1 application topically daily.    Historical Provider, MD  naproxen (NAPROSYN) 500 MG tablet Take 1 tablet (500 mg total) by mouth 2 (two) times daily with a meal. 07/07/13   Noemi Chapel, MD  omeprazole (PRILOSEC) 20 MG capsule Take 20 mg by mouth every morning.    Historical Provider, MD  oxybutynin (DITROPAN-XL) 5 MG 24 hr tablet Take 5 mg by mouth every morning.    Historical Provider, MD  PARoxetine (PAXIL) 40 MG tablet Take 40 mg by mouth daily. 02/04/15   Historical Provider, MD  polyethylene glycol (MIRALAX / GLYCOLAX) packet Take 17 g by mouth daily. For constipation.  Historical Provider, MD    Family History History reviewed. No pertinent family history.  Social History Social History  Substance Use Topics  . Smoking status: Never Smoker  . Smokeless tobacco: Not on file  . Alcohol use No     Allergies   Zolpidem; Codeine; Doxycycline; Fosamax [alendronate sodium]; Morphine and related; Novocain [procaine hcl]; Penicillins; Percocet [oxycodone-acetaminophen]; Skelaxin [metaxalone]; and Sulfa antibiotics   Review of Systems Review of Systems  All other systems reviewed and are negative.    Physical Exam Updated Vital Signs BP 148/66   Pulse 78   Temp 98.2 F (36.8 C) (Oral)   Resp 18   SpO2 96%   Physical Exam    Constitutional: She is oriented to person, place, and time. She appears well-developed and well-nourished.  HENT:  Head: Normocephalic and atraumatic.  Cardiovascular: Normal rate and regular rhythm.   No murmur heard. Pulmonary/Chest: Effort normal and breath sounds normal. No respiratory distress.  Abdominal: Soft. There is no tenderness. There is no rebound and no guarding.  Genitourinary:  Genitourinary Comments: Rectal exam with brown stool, heme negative  Musculoskeletal: She exhibits no edema or tenderness.  Neurological: She is alert and oriented to person, place, and time.  Skin: Skin is warm and dry.  Psychiatric: She has a normal mood and affect. Her behavior is normal.  Nursing note and vitals reviewed.    ED Treatments / Results  Labs (all labs ordered are listed, but only abnormal results are displayed) Labs Reviewed  CBC WITH DIFFERENTIAL/PLATELET - Abnormal; Notable for the following:       Result Value   RBC 1.97 (*)    Hemoglobin 7.1 (*)    HCT 22.4 (*)    MCV 113.7 (*)    MCH 36.0 (*)    RDW 18.1 (*)    Platelets 78 (*)    Monocytes Absolute 1.8 (*)    All other components within normal limits  COMPREHENSIVE METABOLIC PANEL - Abnormal; Notable for the following:    Glucose, Bld 100 (*)    BUN 41 (*)    Creatinine, Ser 1.15 (*)    Calcium 10.6 (*)    Total Bilirubin 1.4 (*)    GFR calc non Af Amer 39 (*)    GFR calc Af Amer 46 (*)    All other components within normal limits  TYPE AND SCREEN  ABO/RH    EKG  EKG Interpretation None       Radiology No results found.  Procedures Procedures (including critical care time)  Medications Ordered in ED Medications - No data to display   Initial Impression / Assessment and Plan / ED Course  I have reviewed the triage vital signs and the nursing notes.  Pertinent labs & imaging results that were available during my care of the patient were reviewed by me and considered in my medical decision  making (see chart for details).  Clinical Course    Pt referred for abnormal lab - low hemoglobin, but lab is not available. Nursing staff contacted NH and was told that labs are not available and they do not have any papers.  Attempted to contact the patient's physician at two different numbers and page was not returned.  Pt with anemia and thrombocytopenia on labs, no recent labs available for comparison.  She is asymptomatic in the ED, hx or exam findings of GI bleed.  Plan to d/c home with outpatient follow up.  Return precautions discussed.    Final Clinical  Impressions(s) / ED Diagnoses   Final diagnoses:  Anemia, unspecified type    New Prescriptions New Prescriptions   No medications on file     Quintella Reichert, MD 05/06/16 0020

## 2016-06-04 ENCOUNTER — Telehealth: Payer: Self-pay | Admitting: Hematology

## 2016-06-04 NOTE — Telephone Encounter (Signed)
[facility] confirmed appt date/time.

## 2016-06-12 ENCOUNTER — Ambulatory Visit (HOSPITAL_BASED_OUTPATIENT_CLINIC_OR_DEPARTMENT_OTHER): Payer: Medicare Other | Admitting: Hematology

## 2016-06-12 VITALS — BP 131/58 | HR 81 | Temp 98.6°F | Resp 18 | Ht 62.4 in | Wt 98.2 lb

## 2016-06-12 DIAGNOSIS — D696 Thrombocytopenia, unspecified: Secondary | ICD-10-CM

## 2016-06-12 DIAGNOSIS — D539 Nutritional anemia, unspecified: Secondary | ICD-10-CM

## 2016-06-12 DIAGNOSIS — I10 Essential (primary) hypertension: Secondary | ICD-10-CM | POA: Diagnosis not present

## 2016-06-12 NOTE — Progress Notes (Signed)
Marland Kitchen    HEMATOLOGY/ONCOLOGY CONSULTATION NOTE  Date of Service: 06/12/2016  Patient Care Team: Seward Carol, MD as PCP - General (Internal Medicine)  CHIEF COMPLAINTS/PURPOSE OF CONSULTATION:  Macrocytic anemia and thrombocytopenia  HISTORY OF PRESENTING ILLNESS:   Carolyn Odonnell is an anxious  82 y.o. female who has been referred to [place] by Dr .Kandice Hams, MDfor evaluation and management of anemia and thrombocytopenia.  Patient has a history of hypertension, osteoporosis, GERD, depression, arthritis who is a very poor historian and has been sent from her senior living facility for evaluation of anemia and thrombocytopenia without any available healthcare agent and no family. She has no idea why she is not clinic and reports that she has no complaints at this time. She reports no overt bleeding or infections. She notes that her son helps her with decision making but is unable to come today. She reports that she would not want to have any evaluation or blood test done to figure out the etiology of her anemia and thrombocytopenia at this time. She notes some easy bruisability but no other overt bleeding.  MEDICAL HISTORY:  Past Medical History:  Diagnosis Date  . Anxiety   . Arthritis   . Chronic headache   . Constipation   . Depression   . GERD (gastroesophageal reflux disease)   . Hypertension   . Osteoporosis     SURGICAL HISTORY: Past Surgical History:  Procedure Laterality Date  . ABDOMINAL HYSTERECTOMY    . CHOLECYSTECTOMY    . COLON SURGERY      SOCIAL HISTORY: Social History   Social History  . Marital status: Married    Spouse name: N/A  . Number of children: N/A  . Years of education: N/A   Occupational History  . Not on file.   Social History Main Topics  . Smoking status: Never Smoker  . Smokeless tobacco: Not on file  . Alcohol use No  . Drug use: No  . Sexual activity: Not on file   Other Topics Concern  . Not on file   Social History  Narrative  . No narrative on file    FAMILY HISTORY: No family history on file.  ALLERGIES:  is allergic to zolpidem; codeine; doxycycline; fosamax [alendronate sodium]; morphine and related; novocain [procaine hcl]; penicillins; percocet [oxycodone-acetaminophen]; skelaxin [metaxalone]; and sulfa antibiotics.  MEDICATIONS:  Current Outpatient Prescriptions  Medication Sig Dispense Refill  . amLODipine-benazepril (LOTREL) 10-40 MG per capsule Take 1 capsule by mouth every morning.    Marland Kitchen atenolol (TENORMIN) 25 MG tablet Take 50 mg by mouth every morning.     . butalbital-aspirin-caffeine (BUTALBITAL COMPOUND/ASA) 50-325-40 MG per capsule Take 1 capsule by mouth 2 (two) times daily.    . cholecalciferol (VITAMIN D) 1000 UNITS tablet Take 2,000 Units by mouth every morning.    . Diphenhyd-Hydrocort-Nystatin (FIRST-DUKES MOUTHWASH MT) Use as directed 30 mLs in the mouth or throat every 6 (six) hours as needed (sore throat).    Marland Kitchen docusate sodium (COLACE) 50 MG capsule Take 50 mg by mouth daily as needed for moderate constipation.     . ENSURE (ENSURE) Take 237 mLs by mouth 3 (three) times daily between meals.    Marland Kitchen estrogens, conjugated, (PREMARIN) 0.9 MG tablet Take 0.9 mg by mouth every morning.    Marland Kitchen guaiFENesin (MUCINEX) 600 MG 12 hr tablet Take 600 mg by mouth 2 (two) times daily as needed for to loosen phlegm.    . metroNIDAZOLE (METROGEL) 1 % gel Apply  1 application topically daily.    . naproxen (NAPROSYN) 500 MG tablet Take 1 tablet (500 mg total) by mouth 2 (two) times daily with a meal. 30 tablet 0  . omeprazole (PRILOSEC) 20 MG capsule Take 20 mg by mouth every morning.    Marland Kitchen oxybutynin (DITROPAN-XL) 5 MG 24 hr tablet Take 5 mg by mouth every morning.    Marland Kitchen PARoxetine (PAXIL) 40 MG tablet Take 40 mg by mouth daily.    . polyethylene glycol (MIRALAX / GLYCOLAX) packet Take 17 g by mouth daily. For constipation.     No current facility-administered medications for this visit.      REVIEW OF SYSTEMS:    10 Point review of Systems was done is negative except as noted above.  PHYSICAL EXAMINATION: ECOG PERFORMANCE STATUS: 3 - Symptomatic, >50% confined to bed  . Vitals:   06/12/16 1413  BP: (!) 131/58  Pulse: 81  Resp: 18  Temp: 98.6 F (37 C)   Filed Weights   06/12/16 1413  Weight: 98 lb 3.2 oz (44.5 kg)   .Body mass index is 17.73 kg/m.  GENERAL: Elderly frail Caucasian female who was hard of hearing. SKIN: thin skin with bilateral senile purpura.  EYES: Conjunctival with pallor noted, sclera clear OROPHARYNX: mmm NECK: supple, no JVD, thyroid normal size, non-tender, without nodularity LYMPH:  no palpable lymphadenopathy in the cervical, axillary or inguinal LUNGS: clear to auscultation with normal respiratory effort HEART: regular rate & rhythm,  no murmurs and no lower extremity edema ABDOMEN: abdomen soft, non-tender, normoactive bowel sounds , no palpable hepatosplenomegaly. Musculoskeletal: no cyanosis of digits and no clubbing  PSYCH: alert & oriented x 3 with fluent speech NEURO: no focal motor/sensory deficits  LABORATORY DATA:  I have reviewed the data as listed  . CBC Latest Ref Rng & Units 05/05/2016 12/27/2011 12/25/2011  WBC 4.0 - 10.5 K/uL 10.1 6.4 10.0  Hemoglobin 12.0 - 15.0 g/dL 7.1(L) 10.8(L) 9.3(L)  Hematocrit 36.0 - 46.0 % 22.4(L) 32.0(L) 27.1(L)  Platelets 150 - 400 K/uL 78(L) 181 158    . CMP Latest Ref Rng & Units 05/05/2016 12/27/2011 12/24/2011  Glucose 65 - 99 mg/dL 100(H) 109(H) 104(H)  BUN 6 - 20 mg/dL 41(H) 12 19  Creatinine 0.44 - 1.00 mg/dL 1.15(H) 0.72 0.73  Sodium 135 - 145 mmol/L 140 135 137  Potassium 3.5 - 5.1 mmol/L 4.6 4.0 3.6  Chloride 101 - 111 mmol/L 111 101 106  CO2 22 - 32 mmol/L 22 27 21   Calcium 8.9 - 10.3 mg/dL 10.6(H) 10.4 9.4  Total Protein 6.5 - 8.1 g/dL 7.4 - 5.5(L)  Total Bilirubin 0.3 - 1.2 mg/dL 1.4(H) - 0.5  Alkaline Phos 38 - 126 U/L 93 - 153(H)  AST 15 - 41 U/L 21 - 19  ALT  14 - 54 U/L 15 - 11     RADIOGRAPHIC STUDIES: I have personally reviewed the radiological images as listed and agreed with the findings in the report. No results found.  ASSESSMENT & PLAN:   82 year old frail-appearing elderly Caucasian female referred for  #1 Macrocytic severe anemia last available labs from 04/27/2016 showed a hemoglobin of 7.1 with an MCV of 113.7 #2 Moderate thrombocytopenia. Platelet count of 78k increased bruising but no overt bleeding at this time. This was labs on 05/05/2016 PLAN Patient is currently not asymptomatic with her anemia she appears pale but no lightheadedness or dizziness. She was evaluated previously in the emergency room and was not transfused. -At this time the  patient is present in clinic alone with no members from her senior living facility or her son Saralyn Pilar phone number [phone]. -She is unable to provide much information and nothing could be determined about presence of gastrointestinal bleeding or other blood loss , infections , bone pain etc.  -Macrocytic anemia. The possibility of several etiologies including MDS given the patient's age, could be from B12 deficiency/folded deficiency .could also have hemolytic anemia with given some polychromasia . Could have other bone marrow disorder . -I tried to discuss with the patient goals of care but had limited responses . -At this time she does not want any additional evaluation or even blood tests at this time . -My nurse call the patient's son reports that they will try to reschedule next available clinic follow-up . -Ideally the patient should discuss goals of care with her primary care physician to determine if she wouldn't even want a hematology evaluation and additional testing which will likely include blood tests and a possible bone marrow examination . -Alternatively the primary care physician could certainly define these goals of cares and consider pursuing best supportive care option if  the patient does not want any blood testing or additional workup . -If the patient got more symptomatic from her anemia she should go to the emergency room for consideration of PRBC transfusion . -Could consider empiric replacement with B complex and B12 depending definitive etiology.  Depending on goals of care with primary care physician may return to clinic with Dr. Irene Limbo in 1-3 weeks if desirous of a hematology evaluation and only if a healthcare proxy /power of attorney /health care surrogate is available for the discussion to help the patient's with decision making .  All of the patients questions were answered with apparent satisfaction. The patient knows to call the clinic with any problems, questions or concerns.  I spent 30 minutes counseling the patient face to face. The total time spent in the appointment was 45 minutes and more than 50% was on counseling and direct patient cares.    Sullivan Lone MD [place] AAHIVMS [facility] Hematology/Oncology Physician [facility]  (Office):       [phone] (Work cell):  [phone] (Fax):           [phone]  06/12/2016 2:51 PM

## 2016-06-13 NOTE — Addendum Note (Signed)
Addended by: Kellie Simmering A on: 06/13/2016 08:55 AM   Modules accepted: Orders

## 2016-06-20 ENCOUNTER — Ambulatory Visit (HOSPITAL_BASED_OUTPATIENT_CLINIC_OR_DEPARTMENT_OTHER): Payer: Medicare Other | Admitting: Hematology

## 2016-06-20 ENCOUNTER — Encounter: Payer: Self-pay | Admitting: Hematology

## 2016-06-20 ENCOUNTER — Telehealth: Payer: Self-pay | Admitting: Hematology

## 2016-06-20 ENCOUNTER — Ambulatory Visit (HOSPITAL_BASED_OUTPATIENT_CLINIC_OR_DEPARTMENT_OTHER): Payer: Medicare Other

## 2016-06-20 ENCOUNTER — Encounter (HOSPITAL_COMMUNITY): Payer: Medicare Other

## 2016-06-20 ENCOUNTER — Ambulatory Visit (HOSPITAL_COMMUNITY)
Admission: RE | Admit: 2016-06-20 | Discharge: 2016-06-20 | Disposition: A | Discharge status: Home / Self Care | Payer: Medicare Other | Source: Ambulatory Visit | Attending: Hematology | Admitting: Hematology

## 2016-06-20 VITALS — BP 142/53 | HR 83 | Temp 98.2°F | Resp 16 | Ht 62.4 in | Wt 99.3 lb

## 2016-06-20 DIAGNOSIS — D696 Thrombocytopenia, unspecified: Secondary | ICD-10-CM

## 2016-06-20 DIAGNOSIS — D539 Nutritional anemia, unspecified: Secondary | ICD-10-CM

## 2016-06-20 LAB — TECHNOLOGIST REVIEW

## 2016-06-20 LAB — CHCC SMEAR

## 2016-06-20 LAB — COMPREHENSIVE METABOLIC PANEL
ALBUMIN: 4.1 g/dL (ref 3.5–5.0)
ALT: 11 U/L (ref 0–55)
AST: 19 U/L (ref 5–34)
Alkaline Phosphatase: 129 U/L (ref 40–150)
Anion Gap: 9 mEq/L (ref 3–11)
BUN: 34.2 mg/dL — ABNORMAL HIGH (ref 7.0–26.0)
CHLORIDE: 110 meq/L — AB (ref 98–109)
CO2: 22 mEq/L (ref 22–29)
Calcium: 11.2 mg/dL — ABNORMAL HIGH (ref 8.4–10.4)
Creatinine: 1.3 mg/dL — ABNORMAL HIGH (ref 0.6–1.1)
EGFR: 35 mL/min/{1.73_m2} — AB (ref >90)
GLUCOSE: 94 mg/dL (ref 70–140)
POTASSIUM: 4.6 meq/L (ref 3.5–5.1)
SODIUM: 141 meq/L (ref 136–145)
Total Bilirubin: 1.33 mg/dL — ABNORMAL HIGH (ref 0.20–1.20)
Total Protein: 7.8 g/dL (ref 6.4–8.3)

## 2016-06-20 LAB — CBC & DIFF AND RETIC
BASO%: 0.1 % (ref 0.0–2.0)
Basophils Absolute: 0 10*3/uL (ref 0.0–0.1)
EOS%: 0.5 % (ref 0.0–7.0)
Eosinophils Absolute: 0.1 10*3/uL (ref 0.0–0.5)
HCT: 23.1 % — ABNORMAL LOW (ref 34.8–46.6)
HGB: 7.2 g/dL — ABNORMAL LOW (ref 11.6–15.9)
Immature Retic Fract: 18.7 % — ABNORMAL HIGH (ref 1.60–10.00)
LYMPH%: 37.6 % (ref 14.0–49.7)
MCH: 35.3 pg — AB (ref 25.1–34.0)
MCHC: 31.2 g/dL — AB (ref 31.5–36.0)
MCV: 113.2 fL — AB (ref 79.5–101.0)
MONO#: 1.9 10*3/uL — ABNORMAL HIGH (ref 0.1–0.9)
MONO%: 18.7 % — AB (ref 0.0–14.0)
NEUT#: 4.5 10*3/uL (ref 1.5–6.5)
NEUT%: 43.1 % (ref 38.4–76.8)
Platelets: 79 10*3/uL — ABNORMAL LOW (ref 145–400)
RBC: 2.04 10*6/uL — AB (ref 3.70–5.45)
RDW: 17.8 % — ABNORMAL HIGH (ref 11.2–14.5)
RETIC %: 4.94 % — AB (ref 0.70–2.10)
Retic Ct Abs: 100.78 10*3/uL — ABNORMAL HIGH (ref 33.70–90.70)
WBC: 10.4 10*3/uL — ABNORMAL HIGH (ref 3.9–10.3)
lymph#: 3.9 10*3/uL — ABNORMAL HIGH (ref 0.9–3.3)

## 2016-06-20 LAB — LACTATE DEHYDROGENASE: LDH: 226 U/L (ref 125–245)

## 2016-06-20 NOTE — Telephone Encounter (Signed)
Patient given avs report and sent back to lab. Per 12/13 los f/u will be based on labs.

## 2016-06-21 ENCOUNTER — Other Ambulatory Visit: Payer: Self-pay | Admitting: *Deleted

## 2016-06-21 DIAGNOSIS — D539 Nutritional anemia, unspecified: Secondary | ICD-10-CM

## 2016-06-21 LAB — IRON AND TIBC
%SAT: 21 % (ref 21–57)
IRON: 77 ug/dL (ref 41–142)
TIBC: 363 ug/dL (ref 236–444)
UIBC: 286 ug/dL (ref 120–384)

## 2016-06-21 LAB — DIRECT ANTIGLOBULIN TEST (NOT AT ARMC): Coombs', Direct: NEGATIVE

## 2016-06-21 LAB — VITAMIN B12: VITAMIN B 12: 646 pg/mL (ref 232–1245)

## 2016-06-21 LAB — HAPTOGLOBIN: Haptoglobin: 85 mg/dL (ref 34–200)

## 2016-06-21 LAB — FOLATE RBC
Folate, Hemolysate: 540.2 ng/mL
Folate, RBC: 2501 ng/mL (ref >498)
Hematocrit: 21.6 % — ABNORMAL LOW (ref 34.0–46.6)

## 2016-06-21 LAB — FERRITIN: FERRITIN: 114 ng/mL (ref 9–269)

## 2016-06-24 LAB — TYPE AND SCREEN
Blood Product Expiration Date: 201712292359
Blood Product Expiration Date: 201712292359
UNIT TYPE AND RH: 6200
Unit Type and Rh: 6200

## 2016-06-25 ENCOUNTER — Ambulatory Visit (HOSPITAL_COMMUNITY)
Admission: RE | Admit: 2016-06-25 | Discharge: 2016-06-25 | Disposition: A | Discharge status: Home / Self Care | Payer: Medicare Other | Source: Ambulatory Visit | Attending: Hematology | Admitting: Hematology

## 2016-06-25 DIAGNOSIS — D696 Thrombocytopenia, unspecified: Secondary | ICD-10-CM

## 2016-06-25 DIAGNOSIS — D539 Nutritional anemia, unspecified: Secondary | ICD-10-CM

## 2016-06-25 LAB — PREPARE RBC (CROSSMATCH)

## 2016-06-25 MED ORDER — SODIUM CHLORIDE 0.9 % IV SOLN: 250.0000 mL | Freq: Once | INTRAVENOUS | Status: DC

## 2016-06-25 MED ORDER — SODIUM CHLORIDE 0.9 % IV SOLN
Freq: Once | INTRAVENOUS | Status: AC
  Administered 2016-06-25: 10:00:00 via INTRAVENOUS

## 2016-06-25 NOTE — Progress Notes (Signed)
Patient ID: Carolyn Odonnell, female   DOB: 1919-08-03, 82 y.o.   MRN: [id number] Procedure: Transfusion of 2 units PRBC via PIV as ordered.  Provider:Gautam Juleen China, MD  Patient tolerated transfusion without reaction. Went over discharge instructions with patient and her son. Alert, oriented and ambulatory with assistance at time of discharge. Discharged to home with son.

## 2016-06-26 LAB — TYPE AND SCREEN
BLOOD PRODUCT EXPIRATION DATE: 201712282359
Blood Product Expiration Date: 201801012359
ISSUE DATE / TIME: 201712180942
ISSUE DATE / TIME: 201712180942
UNIT TYPE AND RH: 6200
Unit Type and Rh: 6200

## 2016-06-26 LAB — MULTIPLE MYELOMA PANEL, SERUM
ALBUMIN/GLOB SERPL: 1.3 (ref 0.7–1.7)
Albumin SerPl Elph-Mcnc: 4 g/dL (ref 2.9–4.4)
Alpha 1: 0.2 g/dL (ref 0.0–0.4)
Alpha2 Glob SerPl Elph-Mcnc: 0.6 g/dL (ref 0.4–1.0)
B-Globulin SerPl Elph-Mcnc: 0.9 g/dL (ref 0.7–1.3)
Gamma Glob SerPl Elph-Mcnc: 1.3 g/dL (ref 0.4–1.8)
Globulin, Total: 3.1 g/dL (ref 2.2–3.9)
IGM (IMMUNOGLOBIN M), SRM: 55 mg/dL (ref 26–217)
IgA, Qn, Serum: 168 mg/dL (ref 64–422)
IgG, Qn, Serum: 1302 mg/dL (ref 700–1600)
M Protein SerPl Elph-Mcnc: 0.2 g/dL — ABNORMAL HIGH
TOTAL PROTEIN: 7.1 g/dL (ref 6.0–8.5)

## 2016-07-05 NOTE — Progress Notes (Signed)
Carolyn Odonnell Kitchen    HEMATOLOGY/ONCOLOGY CLINIC NOTE  Date of Service: .06/20/2016  Patient Care Team: Seward Carol, MD as PCP - General (Internal Medicine)  CHIEF COMPLAINTS/PURPOSE OF CONSULTATION:  Macrocytic anemia and thrombocytopenia  HISTORY OF PRESENTING ILLNESS:   Carolyn Odonnell is an anxious  82 y.o. female who has been referred to [place] by Dr .Kandice Hams, MDfor evaluation and management of anemia and thrombocytopenia.  Patient has a history of hypertension, osteoporosis, GERD, depression, arthritis who is a very poor historian and has been sent from her senior living facility for evaluation of anemia and thrombocytopenia without any available healthcare agent and no family. She has no idea why she is not clinic and reports that she has no complaints at this time. She reports no overt bleeding or infections. She notes that her son helps her with decision making but is unable to come today. She reports that she would not want to have any evaluation or blood test done to figure out the etiology of her anemia and thrombocytopenia at this time. She notes some easy bruisability but no other overt bleeding.  INTERVAL HISTORY  [name] is back for evaluation of her anemia with her son and POA today. She notes maybe being somewhat tired but has no specific symptoms. I discussed diagnostic possibilities with the son and patient and they are okay with some basic labs but no keen on any aggressive workup including BM Bx. No chest pain/SOB/dizziness at this time. MEDICAL HISTORY:  Past Medical History:  Diagnosis Date  . Anxiety   . Arthritis   . Chronic headache   . Constipation   . Depression   . GERD (gastroesophageal reflux disease)   . Hypertension   . Osteoporosis     SURGICAL HISTORY: Past Surgical History:  Procedure Laterality Date  . ABDOMINAL HYSTERECTOMY    . CHOLECYSTECTOMY    . COLON SURGERY      SOCIAL HISTORY: Social History   Social History  . Marital status:  Married    Spouse name: N/A  . Number of children: N/A  . Years of education: N/A   Occupational History  . Not on file.   Social History Main Topics  . Smoking status: Never Smoker  . Smokeless tobacco: Not on file  . Alcohol use No  . Drug use: No  . Sexual activity: Not on file   Other Topics Concern  . Not on file   Social History Narrative  . No narrative on file    FAMILY HISTORY: No family history on file.  ALLERGIES:  is allergic to zolpidem; codeine; doxycycline; fosamax [alendronate sodium]; morphine and related; novocain [procaine hcl]; penicillins; percocet [oxycodone-acetaminophen]; skelaxin [metaxalone]; and sulfa antibiotics.  MEDICATIONS:  Current Outpatient Prescriptions  Medication Sig Dispense Refill  . amLODipine-benazepril (LOTREL) 10-40 MG per capsule Take 1 capsule by mouth every morning.    Carolyn Odonnell Kitchen atenolol (TENORMIN) 25 MG tablet Take 50 mg by mouth every morning.     . cholecalciferol (VITAMIN D) 1000 UNITS tablet Take 2,000 Units by mouth every morning.    Carolyn Odonnell Kitchen ENSURE (ENSURE) Take 237 mLs by mouth 3 (three) times daily between meals.    . ferrous sulfate 325 (65 FE) MG tablet Take 325 mg by mouth daily with breakfast.    . metroNIDAZOLE (METROGEL) 1 % gel Apply 1 application topically daily.    . mirtazapine (REMERON) 15 MG tablet     . omeprazole (PRILOSEC) 20 MG capsule Take 20 mg by mouth every morning.    Carolyn Odonnell Kitchen  PARoxetine (PAXIL) 40 MG tablet Take 40 mg by mouth daily.    . sennosides-docusate sodium (SENOKOT-S) 8.6-50 MG tablet Take 2 tablets by mouth daily.    . traMADol (ULTRAM) 50 MG tablet      No current facility-administered medications for this visit.     REVIEW OF SYSTEMS:    10 Point review of Systems was done is negative except as noted above.  PHYSICAL EXAMINATION: ECOG PERFORMANCE STATUS: 3 - Symptomatic, >50% confined to bed  . Vitals:   06/20/16 1406  BP: (!) 142/53  Pulse: 83  Resp: 16  Temp: 98.2 F (36.8 C)   Filed  Weights   06/20/16 1406  Weight: 99 lb 4.8 oz (45 kg)   .Body mass index is 17.93 kg/m.  GENERAL: Elderly frail Caucasian female who was hard of hearing. SKIN: thin skin with bilateral senile purpura.  EYES: Conjunctival with pallor noted, sclera clear OROPHARYNX: mmm NECK: supple, no JVD, thyroid normal size, non-tender, without nodularity LYMPH:  no palpable lymphadenopathy in the cervical, axillary or inguinal LUNGS: clear to auscultation with normal respiratory effort HEART: regular rate & rhythm,  no murmurs and no lower extremity edema ABDOMEN: abdomen soft, non-tender, normoactive bowel sounds , no palpable hepatosplenomegaly. Musculoskeletal: no cyanosis of digits and no clubbing  PSYCH: alert & oriented x 3 with fluent speech NEURO: no focal motor/sensory deficits  LABORATORY DATA:  I have reviewed the data as listed  . CBC Latest Ref Rng & Units 06/20/2016 06/20/2016 05/05/2016  WBC 3.9 - 10.3 10e3/uL 10.4(H) - 10.1  Hemoglobin 11.6 - 15.9 g/dL 7.2(L) - 7.1(L)  Hematocrit 34.0 - 46.6 % 23.1(L) 21.6(L) 22.4(L)  Platelets 145 - 400 10e3/uL 79(L) - 78(L)   . CBC    Component Value Date/Time   WBC 10.4 (H) 06/20/2016 1521   WBC 10.1 05/05/2016 1604   RBC 2.04 (L) 06/20/2016 1521   RBC 1.97 (L) 05/05/2016 1604   HGB 7.2 (L) 06/20/2016 1521   HCT 21.6 (L) 06/20/2016 1521   HCT 23.1 (L) 06/20/2016 1521   PLT 79 (L) 06/20/2016 1521   MCV 113.2 (H) 06/20/2016 1521   MCH 35.3 (H) 06/20/2016 1521   MCH 36.0 (H) 05/05/2016 1604   MCHC 31.2 (L) 06/20/2016 1521   MCHC 31.7 05/05/2016 1604   RDW 17.8 (H) 06/20/2016 1521   LYMPHSABS 3.9 (H) 06/20/2016 1521   MONOABS 1.9 (H) 06/20/2016 1521   EOSABS 0.1 06/20/2016 1521   BASOSABS 0.0 06/20/2016 1521    . CMP Latest Ref Rng & Units 06/20/2016 06/20/2016 05/05/2016  Glucose 70 - 140 mg/dl 94 - 100(H)  BUN 7.0 - 26.0 mg/dL 34.2(H) - 41(H)  Creatinine 0.6 - 1.1 mg/dL 1.3(H) - 1.15(H)  Sodium 136 - 145 mEq/L 141 - 140    Potassium 3.5 - 5.1 mEq/L 4.6 - 4.6  Chloride 101 - 111 mmol/L - - 111  CO2 22 - 29 mEq/L 22 - 22  Calcium 8.4 - 10.4 mg/dL 11.2(H) - 10.6(H)  Total Protein 6.4 - 8.3 g/dL 7.8 7.1 7.4  Total Bilirubin 0.20 - 1.20 mg/dL 1.33(H) - 1.4(H)  Alkaline Phos 40 - 150 U/L 129 - 93  AST 5 - 34 U/L 19 - 21  ALT 0 - 55 U/L 11 - 15   . Lab Results  Component Value Date   IRON 77 06/20/2016   TIBC 363 06/20/2016   IRONPCTSAT 21 06/20/2016   (Iron and TIBC)  Lab Results  Component Value Date   FERRITIN  114 06/20/2016    Component     Latest Ref Rng & Units 06/20/2016  IgG (Immunoglobin G), Serum     700 - 1600 mg/dL 1,302  IgA/Immunoglobulin A, Serum     64 - 422 mg/dL 168  IgM, Qn, Serum     26 - 217 mg/dL 55  Total Protein     6.0 - 8.5 g/dL 7.1  Albumin SerPl Elph-Mcnc     2.9 - 4.4 g/dL 4.0  Alpha 1     0.0 - 0.4 g/dL 0.2  Alpha2 Glob SerPl Elph-Mcnc     0.4 - 1.0 g/dL 0.6  B-Globulin SerPl Elph-Mcnc     0.7 - 1.3 g/dL 0.9  Gamma Glob SerPl Elph-Mcnc     0.4 - 1.8 g/dL 1.3  M Protein SerPl Elph-Mcnc     Not Observed g/dL 0.2 (H)  Globulin, Total     2.2 - 3.9 g/dL 3.1  Albumin/Glob SerPl     0.7 - 1.7 1.3  IFE 1      Comment  Please Note (HCV):      Comment  Folate, Hemolysate     Not Estab. ng/mL 540.2  HCT     34.0 - 46.6 % 21.6 (L)  Folate, RBC     >498 ng/mL 2,501  Vitamin B12     232 - 1,245 pg/mL 646  LDH     125 - 245 U/L 226  Haptoglobin     34 - 200 mg/dL 85  Coombs', Direct     Negative Negative    RADIOGRAPHIC STUDIES: I have personally reviewed the radiological images as listed and agreed with the findings in the report. No results found.  ASSESSMENT & PLAN:   82 year old frail-appearing elderly Caucasian female referred for  #1 Macrocytic severe anemia last available labs from 04/27/2016 showed a hemoglobin of 7.1 with an MCV of 113.7 #2 Moderate thrombocytopenia. Platelet count of 78k increased bruising but no overt bleeding at this  time. This was labs on 05/05/2016  Rpt labs today show macrocytic anemia hgb 7.2 with MCV of 113.2 B12 and folate WNL PBS -concerning for findings of MDS. Monocytosis might suggest CMML  PLAN - will transfuse patient 2 units of PRBC for somewhat symptomatic severe anemia. -w/u results reviewed. -patient and her son were not to keen on additional evaluation with BM Bx. -would likely pursue supportive care with transfusions as needed for symptomatic anemia. -no overt bleeding reported and no significant iron deficiency noted. -Could consider empiric replacement with B complex and B12   RTC with Dr Irene Limbo in 4-6 weeks with rpt labs and with son.  All of the patients questions were answered with apparent satisfaction. The patient knows to call the clinic with any problems, questions or concerns.  I spent 30 minutes counseling the patient face to face. The total time spent in the appointment was 40 minutes and more than 50% was on counseling and direct patient cares.    Sullivan Lone MD [place] AAHIVMS [facility] Hematology/Oncology Physician [facility]  (Office):       [phone] (Work cell):  [phone] (Fax):           [phone]

## 2016-07-06 ENCOUNTER — Telehealth: Payer: Self-pay | Admitting: *Deleted

## 2016-07-06 ENCOUNTER — Telehealth: Payer: Self-pay | Admitting: Hematology

## 2016-07-06 NOTE — Telephone Encounter (Signed)
Per MD staff message, contacted son Carolyn Odonnell to discuss pt's lab results.  Informed Carolyn Odonnell that most likely pt does have MDS.  Condition was previously discussed with pt and mother during office visit.  Carolyn Odonnell stated they still did not wish to seek treatment or bone marrow biopsy at this point.  Instructed patrick to look out for signs of extremely lethargy, SOB, increased headache, or pale skin in his mother due to anemia.  Also instructed to look out for signs of increased bruising or bleeding.  Advised son that Dr. Irene Limbo would like to see her back with labs in 4-6 weeks.  Son stated he would rather wait until close to six weeks.  Instructed son to call back with any additional questions or concerns in the meantime.  Son verbalized understanding.  Scheduling message sent for lab/MD in approximately 6 weeks.   Office note faxed to nursing facility [facility] on [address])  Dr. Delfina Redwood

## 2016-07-06 NOTE — Telephone Encounter (Signed)
SW PT SON TO CONFIRM FEB APPT DATE/TIME PER LOS

## 2016-08-14 ENCOUNTER — Other Ambulatory Visit: Payer: Self-pay | Admitting: *Deleted

## 2016-08-14 ENCOUNTER — Other Ambulatory Visit (HOSPITAL_BASED_OUTPATIENT_CLINIC_OR_DEPARTMENT_OTHER): Payer: Medicare Other

## 2016-08-14 ENCOUNTER — Ambulatory Visit (HOSPITAL_BASED_OUTPATIENT_CLINIC_OR_DEPARTMENT_OTHER): Payer: Medicare Other | Admitting: Hematology

## 2016-08-14 ENCOUNTER — Other Ambulatory Visit (HOSPITAL_COMMUNITY)
Admission: RE | Admit: 2016-08-14 | Discharge: 2016-08-14 | Disposition: A | Discharge status: Home / Self Care | Payer: Medicare Other | Source: Ambulatory Visit | Attending: Hematology | Admitting: Hematology

## 2016-08-14 ENCOUNTER — Ambulatory Visit (HOSPITAL_COMMUNITY)
Admission: RE | Admit: 2016-08-14 | Discharge: 2016-08-14 | Disposition: A | Discharge status: Home / Self Care | Payer: Medicare Other | Source: Ambulatory Visit | Attending: Hematology | Admitting: Hematology

## 2016-08-14 ENCOUNTER — Encounter: Payer: Self-pay | Admitting: Hematology

## 2016-08-14 ENCOUNTER — Ambulatory Visit (HOSPITAL_BASED_OUTPATIENT_CLINIC_OR_DEPARTMENT_OTHER): Payer: Medicare Other

## 2016-08-14 VITALS — BP 125/44 | HR 79 | Temp 98.2°F | Resp 15 | Ht 62.4 in | Wt 96.2 lb

## 2016-08-14 DIAGNOSIS — D539 Nutritional anemia, unspecified: Secondary | ICD-10-CM

## 2016-08-14 DIAGNOSIS — D649 Anemia, unspecified: Secondary | ICD-10-CM | POA: Diagnosis present

## 2016-08-14 DIAGNOSIS — D696 Thrombocytopenia, unspecified: Secondary | ICD-10-CM | POA: Diagnosis present

## 2016-08-14 LAB — CBC WITH DIFFERENTIAL/PLATELET
Basophils Absolute: 0 10*3/uL (ref 0.0–0.1)
Basophils Relative: 0 %
EOS PCT: 0 %
Eosinophils Absolute: 0 10*3/uL (ref 0.0–0.7)
HCT: 28.2 % — ABNORMAL LOW (ref 36.0–46.0)
Hemoglobin: 9.3 g/dL — ABNORMAL LOW (ref 12.0–15.0)
LYMPHS ABS: 4.5 10*3/uL — AB (ref 0.7–4.0)
Lymphocytes Relative: 42 %
MCH: 32.2 pg (ref 26.0–34.0)
MCHC: 33 g/dL (ref 30.0–36.0)
MCV: 97.6 fL (ref 78.0–100.0)
MONO ABS: 2.1 10*3/uL — AB (ref 0.1–1.0)
Monocytes Relative: 19 %
NEUTROS PCT: 39 %
Neutro Abs: 4.3 10*3/uL (ref 1.7–7.7)
PLATELETS: 60 10*3/uL — AB (ref 150–400)
RBC: 2.89 MIL/uL — AB (ref 3.87–5.11)
RDW: 22 % — AB (ref 11.5–15.5)
WBC: 10.9 10*3/uL — AB (ref 4.0–10.5)

## 2016-08-14 LAB — RETICULOCYTES
RBC.: 2.89 MIL/uL — ABNORMAL LOW (ref 3.87–5.11)
RETIC COUNT ABSOLUTE: 115.6 10*3/uL (ref 19.0–186.0)
RETIC CT PCT: 4 % — AB (ref 0.4–3.1)

## 2016-08-14 LAB — COMPREHENSIVE METABOLIC PANEL
ALBUMIN: 4.1 g/dL (ref 3.5–5.0)
ALK PHOS: 122 U/L (ref 40–150)
ALT: 9 U/L (ref 0–55)
AST: 17 U/L (ref 5–34)
Anion Gap: 9 mEq/L (ref 3–11)
BUN: 35.5 mg/dL — AB (ref 7.0–26.0)
CHLORIDE: 107 meq/L (ref 98–109)
CO2: 22 meq/L (ref 22–29)
Calcium: 10.9 mg/dL — ABNORMAL HIGH (ref 8.4–10.4)
Creatinine: 1.7 mg/dL — ABNORMAL HIGH (ref 0.6–1.1)
EGFR: 26 mL/min/{1.73_m2} — ABNORMAL LOW (ref >90)
GLUCOSE: 127 mg/dL (ref 70–140)
POTASSIUM: 4.5 meq/L (ref 3.5–5.1)
SODIUM: 138 meq/L (ref 136–145)
Total Bilirubin: 1.52 mg/dL — ABNORMAL HIGH (ref 0.20–1.20)
Total Protein: 7.7 g/dL (ref 6.4–8.3)

## 2016-08-14 LAB — CBC & DIFF AND RETIC
BASO%: 0.1 % (ref 0.0–2.0)
Basophils Absolute: 0 10*3/uL (ref 0.0–0.1)
EOS ABS: 0 10*3/uL (ref 0.0–0.5)
EOS%: 0.4 % (ref 0.0–7.0)
HCT: 19.4 % — ABNORMAL LOW (ref 34.8–46.6)
HEMOGLOBIN: 5.8 g/dL — AB (ref 11.6–15.9)
IMMATURE RETIC FRACT: 27.3 % — AB (ref 1.60–10.00)
LYMPH%: 35.7 % (ref 14.0–49.7)
MCH: 33 pg (ref 25.1–34.0)
MCHC: 29.9 g/dL — ABNORMAL LOW (ref 31.5–36.0)
MCV: 110.2 fL — ABNORMAL HIGH (ref 79.5–101.0)
MONO#: 1.8 10*3/uL — AB (ref 0.1–0.9)
MONO%: 19.4 % — ABNORMAL HIGH (ref 0.0–14.0)
NEUT%: 44.4 % (ref 38.4–76.8)
NEUTROS ABS: 4.1 10*3/uL (ref 1.5–6.5)
Platelets: 66 10*3/uL — ABNORMAL LOW (ref 145–400)
RBC: 1.76 10*6/uL — ABNORMAL LOW (ref 3.70–5.45)
RDW: 22.4 % — AB (ref 11.2–14.5)
RETIC %: 6.23 % — AB (ref 0.70–2.10)
Retic Ct Abs: 109.65 10*3/uL — ABNORMAL HIGH (ref 33.70–90.70)
WBC: 9.3 10*3/uL (ref 3.9–10.3)
lymph#: 3.3 10*3/uL (ref 0.9–3.3)

## 2016-08-14 LAB — IRON AND TIBC
%SAT: 37 % (ref 21–57)
IRON: 120 ug/dL (ref 41–142)
TIBC: 324 ug/dL (ref 236–444)
UIBC: 204 ug/dL (ref 120–384)

## 2016-08-14 LAB — LACTATE DEHYDROGENASE: LDH: 245 U/L (ref 125–245)

## 2016-08-14 LAB — FERRITIN: Ferritin: 387 ng/ml — ABNORMAL HIGH (ref 9–269)

## 2016-08-14 LAB — PREPARE RBC (CROSSMATCH)

## 2016-08-14 LAB — TECHNOLOGIST REVIEW

## 2016-08-14 MED ORDER — ACETAMINOPHEN 325 MG PO TABS
ORAL_TABLET | ORAL | Status: AC
  Filled 2016-08-14: qty 2

## 2016-08-14 MED ORDER — ACETAMINOPHEN 325 MG PO TABS
650.0000 mg | ORAL_TABLET | Freq: Once | ORAL | Status: AC
  Administered 2016-08-14: 650 mg via ORAL

## 2016-08-14 MED ORDER — FUROSEMIDE 10 MG/ML IJ SOLN
INTRAMUSCULAR | Status: AC
  Filled 2016-08-14: qty 2

## 2016-08-14 MED ORDER — B-12 1000 MCG SL SUBL: 1000.0000 ug | SUBLINGUAL_TABLET | Freq: Every day | SUBLINGUAL | 3 refills | Status: AC

## 2016-08-14 MED ORDER — B COMPLEX VITAMINS PO CAPS: 1.0000 | ORAL_CAPSULE | Freq: Every day | ORAL | 2 refills | Status: AC

## 2016-08-14 MED ORDER — FUROSEMIDE 10 MG/ML IJ SOLN
20.0000 mg | Freq: Once | INTRAMUSCULAR | Status: AC
Start: 2016-08-14 — End: 2016-08-14
  Administered 2016-08-14: 20 mg via INTRAVENOUS

## 2016-08-14 MED ORDER — SODIUM CHLORIDE 0.9 % IV SOLN
250.0000 mL | Freq: Once | INTRAVENOUS | Status: AC
  Administered 2016-08-14: 250 mL via INTRAVENOUS

## 2016-08-14 NOTE — Patient Instructions (Signed)
Blood Transfusion , Adult A blood transfusion is a procedure in which you receive donated blood, including plasma, platelets, and red blood cells, through an IV tube. You may need a blood transfusion because of illness, surgery, or injury. The blood may come from a donor. You may also be able to donate blood for yourself (autologous blood donation) before a surgery if you know that you might require a blood transfusion. The blood given in a transfusion is made up of different types of cells. You may receive:  Red blood cells. These carry oxygen to the cells in the body.  White blood cells. These help you fight infections.  Platelets. These help your blood to clot.  Plasma. This is the liquid part of your blood and it helps with fluid imbalances. If you have hemophilia or another clotting disorder, you may also receive other types of blood products. Tell a health care provider about:  Any allergies you have.  All medicines you are taking, including vitamins, herbs, eye drops, creams, and over-the-counter medicines.  Any problems you or family members have had with anesthetic medicines.  Any blood disorders you have.  Any surgeries you have had.  Any medical conditions you have, including any recent fever or cold symptoms.  Whether you are pregnant or may be pregnant.  Any previous reactions you have had during a blood transfusion. What are the risks? Generally, this is a safe procedure. However, problems may occur, including:  Having an allergic reaction to something in the donated blood. Hives and itching may be symptoms of this type of reaction.  Fever. This may be a reaction to the white blood cells in the transfused blood. Nausea or chest pain may accompany a fever.  Iron overload. This can happen from having many transfusions.  Transfusion-related acute lung injury (TRALI). This is a rare reaction that causes lung damage. The cause is not known.TRALI can occur within hours  of a transfusion or several days later.  Sudden (acute) or delayed hemolytic reactions. This happens if your blood does not match the cells in your transfusion. Your body's defense system (immune system) may try to attack the new cells. This complication is rare. The symptoms include fever, chills, nausea, and low back pain or chest pain.  Infection or disease transmission. This is rare. What happens before the procedure?  You will have a blood test to determine your blood type. This is necessary to know what kind of blood your body will accept and to match it to the donor blood.  If you are going to have a planned surgery, you may be able to do an autologous blood donation. This may be done in case you need to have a transfusion.  If you have had an allergic reaction to a transfusion in the past, you may be given medicine to help prevent a reaction. This medicine may be given to you by mouth or through an IV tube.  You will have your temperature, blood pressure, and pulse monitored before the transfusion.  Follow instructions from your health care provider about eating and drinking restrictions.  Ask your health care provider about:  Changing or stopping your regular medicines. This is especially important if you are taking diabetes medicines or blood thinners.  Taking medicines such as aspirin and ibuprofen. These medicines can thin your blood. Do not take these medicines before your procedure if your health care provider instructs you not to. What happens during the procedure?  An IV tube will be   inserted into one of your veins.  The bag of donated blood will be attached to your IV tube. The blood will then enter through your vein.  Your temperature, blood pressure, and pulse will be monitored regularly during the transfusion. This monitoring is done to detect early signs of a transfusion reaction.  If you have any signs or symptoms of a reaction, your transfusion will be stopped and  you may be given medicine.  When the transfusion is complete, your IV tube will be removed.  Pressure may be applied to the IV site for a few minutes.  A bandage (dressing) will be applied. The procedure may vary among health care providers and hospitals. What happens after the procedure?  Your temperature, blood pressure, heart rate, breathing rate, and blood oxygen level will be monitored often.  Your blood may be tested to see how you are responding to the transfusion.  You may be warmed with fluids or blankets to maintain a normal body temperature. Summary  A blood transfusion is a procedure in which you receive donated blood, including plasma, platelets, and red blood cells, through an IV tube.  Your temperature, blood pressure, and pulse will be monitored before, during, and after the transfusion.  Your blood may be tested after the transfusion to see how your body has responded. This information is not intended to replace advice given to you by your health care provider. Make sure you discuss any questions you have with your health care provider. Document Released: 06/22/2000 Document Revised: 03/22/2016 Document Reviewed: 03/22/2016 Elsevier Interactive Patient Education  2017 [employer]  

## 2016-08-15 LAB — KAPPA/LAMBDA LIGHT CHAINS
IG KAPPA FREE LIGHT CHAIN: 441.7 mg/L — AB (ref 3.3–19.4)
IG LAMBDA FREE LIGHT CHAIN: 16.4 mg/L (ref 5.7–26.3)
Kappa/Lambda FluidC Ratio: 26.93 — ABNORMAL HIGH (ref 0.26–1.65)

## 2016-08-15 LAB — TYPE AND SCREEN
ABO/RH(D): AB POS
Antibody Screen: NEGATIVE
Unit division: 0
Unit division: 0

## 2016-08-15 LAB — FOLATE RBC
Folate, Hemolysate: 456.7 ng/mL
Folate, RBC: 2496 ng/mL (ref >498)
Hematocrit: 18.3 % — ABNORMAL LOW (ref 34.0–46.6)

## 2016-08-15 LAB — VITAMIN B12: Vitamin B12: 655 pg/mL (ref 232–1245)

## 2016-08-15 LAB — FOLATE: FOLATE: 19.8 ng/mL (ref >3.0)

## 2016-08-15 LAB — ERYTHROPOIETIN: ERYTHROPOIETIN: 100.6 m[IU]/mL — AB (ref 2.6–18.5)

## 2016-08-16 LAB — MULTIPLE MYELOMA PANEL, SERUM
ALBUMIN/GLOB SERPL: 1.3 (ref 0.7–1.7)
ALPHA2 GLOB SERPL ELPH-MCNC: 0.6 g/dL (ref 0.4–1.0)
Albumin SerPl Elph-Mcnc: 4.1 g/dL (ref 2.9–4.4)
Alpha 1: 0.3 g/dL (ref 0.0–0.4)
B-GLOBULIN SERPL ELPH-MCNC: 0.8 g/dL (ref 0.7–1.3)
GLOBULIN, TOTAL: 3.2 g/dL (ref 2.2–3.9)
Gamma Glob SerPl Elph-Mcnc: 1.6 g/dL (ref 0.4–1.8)
IgA, Qn, Serum: 158 mg/dL (ref 64–422)
IgM, Qn, Serum: 54 mg/dL (ref 26–217)
M PROTEIN SERPL ELPH-MCNC: 0.2 g/dL — AB
TOTAL PROTEIN: 7.3 g/dL (ref 6.0–8.5)

## 2016-08-18 ENCOUNTER — Telehealth: Payer: Self-pay | Admitting: Hematology

## 2016-08-18 NOTE — Telephone Encounter (Signed)
Appointments scheduled per 2/6 LOS. Patient notified. °

## 2016-08-26 NOTE — Progress Notes (Signed)
[facility]    HEMATOLOGY/ONCOLOGY CLINIC NOTE  Date of Service: .08/14/2016  Patient Care Team: Seward Carol, MD as PCP - General (Internal Medicine)  CHIEF COMPLAINTS/PURPOSE OF CONSULTATION:  Macrocytic anemia and thrombocytopenia  HISTORY OF PRESENTING ILLNESS:   Carolyn Odonnell is an anxious  83 y.o. female who has been referred to [place] by Dr .Kandice Hams, MDfor evaluation and management of anemia and thrombocytopenia.  Patient has a history of hypertension, osteoporosis, GERD, depression, arthritis who is a very poor historian and has been sent from her senior living facility for evaluation of anemia and thrombocytopenia without any available healthcare agent and no family. She has no idea why she is not clinic and reports that she has no complaints at this time. She reports no overt bleeding or infections. She notes that her son helps her with decision making but is unable to come today. She reports that she would not want to have any evaluation or blood test done to figure out the etiology of her anemia and thrombocytopenia at this time. She notes some easy bruisability but no other overt bleeding.  INTERVAL HISTORY  Carolyn Odonnell is back for evaluation of her anemia with her son and POA today. She felt better after the previous PRBC transfusions but has been feeling fatigue and somewhat lightheadedness again. Her hgb is back down to 5.8. No chest pain at this time. We  MEDICAL HISTORY:  Past Medical History:  Diagnosis Date  . Anxiety   . Arthritis   . Chronic headache   . Constipation   . Depression   . GERD (gastroesophageal reflux disease)   . Hypertension   . Osteoporosis     SURGICAL HISTORY: Past Surgical History:  Procedure Laterality Date  . ABDOMINAL HYSTERECTOMY    . CHOLECYSTECTOMY    . COLON SURGERY      SOCIAL HISTORY: Social History   Social History  . Marital status: Married    Spouse name: N/A  . Number of children: N/A  . Years of education: N/A    Occupational History  . Not on file.   Social History Main Topics  . Smoking status: Never Smoker  . Smokeless tobacco: Not on file  . Alcohol use No  . Drug use: No  . Sexual activity: Not on file   Other Topics Concern  . Not on file   Social History Narrative  . No narrative on file    FAMILY HISTORY: No family history on file.  ALLERGIES:  is allergic to zolpidem; codeine; doxycycline; fosamax [alendronate sodium]; morphine and related; novocain [procaine hcl]; penicillins; percocet [oxycodone-acetaminophen]; skelaxin [metaxalone]; and sulfa antibiotics.  MEDICATIONS:  Current Outpatient Prescriptions  Medication Sig Dispense Refill  . amLODipine-benazepril (LOTREL) 10-40 MG per capsule Take 1 capsule by mouth every morning.    [facility] Kitchen atenolol (TENORMIN) 25 MG tablet Take 50 mg by mouth every morning.     Marland Kitchen b complex vitamins capsule Take 1 capsule by mouth daily. 30 capsule 2  . cholecalciferol (VITAMIN D) 1000 UNITS tablet Take 2,000 Units by mouth every morning.    . Cyanocobalamin (B-12) 1000 MCG SUBL Place 1,000 mcg under the tongue daily. 30 each 3  . ENSURE (ENSURE) Take 237 mLs by mouth 3 (three) times daily between meals.    . ferrous sulfate 325 (65 FE) MG tablet Take 325 mg by mouth daily with breakfast.    . metroNIDAZOLE (METROGEL) 1 % gel Apply 1 application topically daily.    . mirtazapine (REMERON)  15 MG tablet     . omeprazole (PRILOSEC) 20 MG capsule Take 20 mg by mouth every morning.    Marland Kitchen PARoxetine (PAXIL) 40 MG tablet Take 40 mg by mouth daily.    . sennosides-docusate sodium (SENOKOT-S) 8.6-50 MG tablet Take 2 tablets by mouth daily.    . traMADol (ULTRAM) 50 MG tablet      No current facility-administered medications for this visit.     REVIEW OF SYSTEMS:    10 Point review of Systems was done is negative except as noted above.  PHYSICAL EXAMINATION: ECOG PERFORMANCE STATUS: 3 - Symptomatic, >50% confined to bed  . Vitals:   08/14/16 1102   BP: (!) 125/44  Pulse: 79  Resp: 15  Temp: 98.2 F (36.8 C)   Filed Weights   08/14/16 1102  Weight: 96 lb 3.2 oz (43.6 kg)   .Body mass index is 17.37 kg/m.  GENERAL: Elderly frail Caucasian female who was hard of hearing. SKIN: thin skin with bilateral senile purpura.  EYES: Conjunctival with pallor noted, sclera clear OROPHARYNX: mmm NECK: supple, no JVD, thyroid normal size, non-tender, without nodularity LYMPH:  no palpable lymphadenopathy in the cervical, axillary or inguinal LUNGS: clear to auscultation with normal respiratory effort HEART: regular rate & rhythm,  no murmurs and no lower extremity edema ABDOMEN: abdomen soft, non-tender, normoactive bowel sounds , no palpable hepatosplenomegaly. Musculoskeletal: no cyanosis of digits and no clubbing  PSYCH: alert & oriented x 3 with fluent speech NEURO: no focal motor/sensory deficits  LABORATORY DATA:  I have reviewed the data as listed  . CBC Latest Ref Rng & Units 08/14/2016 08/14/2016 08/14/2016  WBC 4.0 - 10.5 K/uL 10.9(H) 9.3 -  Hemoglobin 12.0 - 15.0 g/dL 9.3(L) 5.8(LL) -  Hematocrit 36.0 - 46.0 % 28.2(L) 19.4(L) 18.3(L)  Platelets 150 - 400 K/uL 60(L) 66(L) -   . CBC    Component Value Date/Time   WBC 10.9 (H) 08/14/2016 1416   RBC 2.89 (L) 08/14/2016 1416   RBC 2.89 (L) 08/14/2016 1416   HGB 9.3 (L) 08/14/2016 1416   HGB 5.8 (LL) 08/14/2016 1032   HCT 28.2 (L) 08/14/2016 1416   HCT 19.4 (L) 08/14/2016 1032   HCT 18.3 (L) 08/14/2016 1032   PLT 60 (L) 08/14/2016 1416   PLT 66 (L) 08/14/2016 1032   MCV 97.6 08/14/2016 1416   MCV 110.2 (H) 08/14/2016 1032   MCH 32.2 08/14/2016 1416   MCHC 33.0 08/14/2016 1416   RDW 22.0 (H) 08/14/2016 1416   RDW 22.4 (H) 08/14/2016 1032   LYMPHSABS 4.5 (H) 08/14/2016 1416   LYMPHSABS 3.3 08/14/2016 1032   MONOABS 2.1 (H) 08/14/2016 1416   MONOABS 1.8 (H) 08/14/2016 1032   EOSABS 0.0 08/14/2016 1416   EOSABS 0.0 08/14/2016 1032   BASOSABS 0.0 08/14/2016 1416    BASOSABS 0.0 08/14/2016 1032    . CMP Latest Ref Rng & Units 08/14/2016 08/14/2016 06/20/2016  Glucose 70 - 140 mg/dl 127 - 94  BUN 7.0 - 26.0 mg/dL 35.5(H) - 34.2(H)  Creatinine 0.6 - 1.1 mg/dL 1.7(H) - 1.3(H)  Sodium 136 - 145 mEq/L 138 - 141  Potassium 3.5 - 5.1 mEq/L 4.5 - 4.6  Chloride 101 - 111 mmol/L - - -  CO2 22 - 29 mEq/L 22 - 22  Calcium 8.4 - 10.4 mg/dL 10.9(H) - 11.2(H)  Total Protein 6.0 - 8.5 g/dL 7.7 7.3 7.8  Total Bilirubin 0.20 - 1.20 mg/dL 1.52(H) - 1.33(H)  Alkaline Phos 40 - 150  U/L 122 - 129  AST 5 - 34 U/L 17 - 19  ALT 0 - 55 U/L 9 - 11   . Lab Results  Component Value Date   IRON 120 08/14/2016   TIBC 324 08/14/2016   IRONPCTSAT 37 08/14/2016   (Iron and TIBC)  Lab Results  Component Value Date   FERRITIN 387 (H) 08/14/2016   Component     Latest Ref Rng & Units 08/14/2016  IgG (Immunoglobin G), Serum     700 - 1600 mg/dL 1,642 (H)  IgA/Immunoglobulin A, Serum     64 - 422 mg/dL 158  IgM, Qn, Serum     26 - 217 mg/dL 54  Total Protein     6.0 - 8.5 g/dL 7.3  Albumin SerPl Elph-Mcnc     2.9 - 4.4 g/dL 4.1  Alpha 1     0.0 - 0.4 g/dL 0.3  Alpha2 Glob SerPl Elph-Mcnc     0.4 - 1.0 g/dL 0.6  B-Globulin SerPl Elph-Mcnc     0.7 - 1.3 g/dL 0.8  Gamma Glob SerPl Elph-Mcnc     0.4 - 1.8 g/dL 1.6  M Protein SerPl Elph-Mcnc     Not Observed g/dL 0.2 (H)  Globulin, Total     2.2 - 3.9 g/dL 3.2  Albumin/Glob SerPl     0.7 - 1.7 1.3  IFE 1      Comment  Please Note (HCV):      Comment  Iron     41 - 142 ug/dL 120  TIBC     236 - 444 ug/dL 324  UIBC     120 - 384 ug/dL 204  %SAT     21 - 57 % 37  Folate, Hemolysate     Not Estab. ng/mL 456.7  HCT     34.0 - 46.6 % 18.3 (L)  Folate, RBC     >498 ng/mL 2,496  Ig Kappa Free Light Chain     3.3 - 19.4 mg/L 441.7 (H)  Ig Lambda Free Light Chain     5.7 - 26.3 mg/L 16.4  Kappa/Lambda FluidC Ratio     0.26 - 1.65 26.93 (H)  Vitamin B12     232 - 1,245 pg/mL 655  LDH     125 - 245 U/L  245  Folate     >3.0 ng/mL 19.8  Ferritin     9 - 269 ng/ml 387 (H)  Erythropoietin     2.6 - 18.5 mIU/mL 100.6 (H)   RADIOGRAPHIC STUDIES: I have personally reviewed the radiological images as listed and agreed with the findings in the report. No results found.  ASSESSMENT & PLAN:   83 year old frail-appearing elderly Caucasian female referred for  #1 Macrocytic severe anemia last available labs from 04/27/2016 showed a hemoglobin of 7.1 with an MCV of 113.7 hgb today is down to 5.8 and patient is symptomatic. #2 Moderate thrombocytopenia. Platelet count of 78k increased bruising but no overt bleeding at this time. This was labs on 05/05/2016. Platelets are now down to 60k.  Rpt labs today show macrocytic anemia hgb 7.2 with MCV of 113.2 B12 and folate WNL PBS -concerning for findings of MDS. Monocytosis might suggest CMML  SPEP showed a M spike with only 0.2gm/dl of IgG kappa monoclonal protein however she has significant elevation of Ig Kappa FLC and K/L ratio of 27 concern for myeloma especially with hypercalcemia and some bump in renal function. PLAN - will transfuse patient 2 units of PRBC  for somewhat symptomatic severe anemia (hgb 5.8) -post transfusion hgb was up to 9.3 -we rediscussed goals of cares and patient and her son were not to keen on additional evaluation with BM Bx or aggressive treatments.  -they would like to pursue supportive care with transfusions as needed for symptomatic anemia. -no overt bleeding reported and no significant iron deficiency noted. -continue empiric replacement with B complex and B12  -I shall discuss the concerns for myeloma in addition to likely MDS on her f/u visit on 08/28/2016.  RTC with Dr Irene Limbo in 2weeks with rpt labs and with son.  All of the patients questions were answered with apparent satisfaction. The patient knows to call the clinic with any problems, questions or concerns.  I spent 30 minutes counseling the patient face to  face. The total time spent in the appointment was 40 minutes and more than 50% was on counseling and direct patient cares.    Sullivan Lone MD [place] AAHIVMS [facility] Hematology/Oncology Physician [facility]  (Office):       [phone] (Work cell):  [phone] (Fax):           [phone]

## 2016-08-28 ENCOUNTER — Other Ambulatory Visit (HOSPITAL_BASED_OUTPATIENT_CLINIC_OR_DEPARTMENT_OTHER): Payer: Medicare Other

## 2016-08-28 ENCOUNTER — Ambulatory Visit (HOSPITAL_BASED_OUTPATIENT_CLINIC_OR_DEPARTMENT_OTHER): Payer: Medicare Other | Admitting: Hematology

## 2016-08-28 ENCOUNTER — Encounter: Payer: Self-pay | Admitting: Hematology

## 2016-08-28 ENCOUNTER — Telehealth: Payer: Self-pay | Admitting: Hematology

## 2016-08-28 VITALS — BP 138/49 | HR 66 | Temp 98.6°F | Resp 17 | Ht 62.4 in | Wt 96.7 lb

## 2016-08-28 DIAGNOSIS — D696 Thrombocytopenia, unspecified: Secondary | ICD-10-CM

## 2016-08-28 DIAGNOSIS — D509 Iron deficiency anemia, unspecified: Secondary | ICD-10-CM | POA: Diagnosis present

## 2016-08-28 DIAGNOSIS — D539 Nutritional anemia, unspecified: Secondary | ICD-10-CM

## 2016-08-28 DIAGNOSIS — Z7189 Other specified counseling: Secondary | ICD-10-CM

## 2016-08-28 DIAGNOSIS — D469 Myelodysplastic syndrome, unspecified: Secondary | ICD-10-CM

## 2016-08-28 DIAGNOSIS — E8809 Other disorders of plasma-protein metabolism, not elsewhere classified: Secondary | ICD-10-CM

## 2016-08-28 LAB — CBC & DIFF AND RETIC
BASO%: 0.2 % (ref 0.0–2.0)
Basophils Absolute: 0 10*3/uL (ref 0.0–0.1)
EOS ABS: 0.1 10*3/uL (ref 0.0–0.5)
EOS%: 0.5 % (ref 0.0–7.0)
HCT: 27.1 % — ABNORMAL LOW (ref 34.8–46.6)
HEMOGLOBIN: 8.6 g/dL — AB (ref 11.6–15.9)
IMMATURE RETIC FRACT: 14.7 % — AB (ref 1.60–10.00)
LYMPH%: 43.2 % (ref 14.0–49.7)
MCH: 32.3 pg (ref 25.1–34.0)
MCHC: 31.7 g/dL (ref 31.5–36.0)
MCV: 101.9 fL — ABNORMAL HIGH (ref 79.5–101.0)
MONO#: 1.5 10*3/uL — AB (ref 0.1–0.9)
MONO%: 16.5 % — ABNORMAL HIGH (ref 0.0–14.0)
NEUT%: 39.6 % (ref 38.4–76.8)
NEUTROS ABS: 3.7 10*3/uL (ref 1.5–6.5)
Platelets: 65 10*3/uL — ABNORMAL LOW (ref 145–400)
RBC: 2.66 10*6/uL — AB (ref 3.70–5.45)
RDW: 20.8 % — AB (ref 11.2–14.5)
RETIC %: 2.65 % — AB (ref 0.70–2.10)
Retic Ct Abs: 70.49 10*3/uL (ref 33.70–90.70)
WBC: 9.3 10*3/uL (ref 3.9–10.3)
lymph#: 4 10*3/uL — ABNORMAL HIGH (ref 0.9–3.3)

## 2016-08-28 MED ORDER — DEXAMETHASONE 4 MG PO TABS: 20.0000 mg | ORAL_TABLET | ORAL | 1 refills | Status: AC

## 2016-08-28 NOTE — Telephone Encounter (Signed)
Appointments scheduled per 08/28/16 los. Patient was given a copy of the AVS report and appointment schedule per 08/28/16 los.

## 2016-09-01 NOTE — Progress Notes (Signed)
Marland Kitchen    HEMATOLOGY/ONCOLOGY CLINIC NOTE  Date of Service: .08/28/2016  Patient Care Team: Seward Carol, MD as PCP - General (Internal Medicine)  CHIEF COMPLAINTS/PURPOSE OF CONSULTATION:  Macrocytic anemia and thrombocytopenia  HISTORY OF PRESENTING ILLNESS:   Carolyn Odonnell is an anxious  83 y.o. female who has been referred to [place] by Dr .Kandice Hams, MDfor evaluation and management of anemia and thrombocytopenia.  Patient has a history of hypertension, osteoporosis, GERD, depression, arthritis who is a very poor historian and has been sent from her senior living facility for evaluation of anemia and thrombocytopenia without any available healthcare agent and no family. She has no idea why she is not clinic and reports that she has no complaints at this time. She reports no overt bleeding or infections. She notes that her son helps her with decision making but is unable to come today. She reports that she would not want to have any evaluation or blood test done to figure out the etiology of her anemia and thrombocytopenia at this time. She notes some easy bruisability but no other overt bleeding.  INTERVAL HISTORY  Ms [name] is back for evaluation of her anemia with her son and POA today. She felt better after the previous PRBC transfusions. Patient notes no acute symptoms since her last clinic visit.  I discussed lab findings including concern for a possible paraproteinemia. Patient and her son have decided not to pursue additional bone marrow examinations. Patient is okay with the idea of considering as needed blood transfusions and was okay with the idea of considering weekly dexamethasone for treatment of possible paraproteinemia.  MEDICAL HISTORY:  Past Medical History:  Diagnosis Date  . Anxiety   . Arthritis   . Chronic headache   . Constipation   . Depression   . GERD (gastroesophageal reflux disease)   . Hypertension   . Osteoporosis     SURGICAL HISTORY: Past  Surgical History:  Procedure Laterality Date  . ABDOMINAL HYSTERECTOMY    . CHOLECYSTECTOMY    . COLON SURGERY      SOCIAL HISTORY: Social History   Social History  . Marital status: Married    Spouse name: N/A  . Number of children: N/A  . Years of education: N/A   Occupational History  . Not on file.   Social History Main Topics  . Smoking status: Never Smoker  . Smokeless tobacco: Never Used  . Alcohol use No  . Drug use: No  . Sexual activity: Not on file   Other Topics Concern  . Not on file   Social History Narrative  . No narrative on file    FAMILY HISTORY: History reviewed. No pertinent family history.  ALLERGIES:  is allergic to zolpidem; codeine; doxycycline; fosamax [alendronate sodium]; morphine and related; novocain [procaine hcl]; penicillins; percocet [oxycodone-acetaminophen]; skelaxin [metaxalone]; and sulfa antibiotics.  MEDICATIONS:  Current Outpatient Prescriptions  Medication Sig Dispense Refill  . amLODipine-benazepril (LOTREL) 10-40 MG per capsule Take 1 capsule by mouth every morning.    Marland Kitchen atenolol (TENORMIN) 25 MG tablet Take 50 mg by mouth every morning.     Marland Kitchen b complex vitamins capsule Take 1 capsule by mouth daily. 30 capsule 2  . cholecalciferol (VITAMIN D) 1000 UNITS tablet Take 2,000 Units by mouth every morning.    . Cyanocobalamin (B-12) 1000 MCG SUBL Place 1,000 mcg under the tongue daily. 30 each 3  . ENSURE (ENSURE) Take 237 mLs by mouth 3 (three) times daily between meals.    Marland Kitchen  ferrous sulfate 325 (65 FE) MG tablet Take 325 mg by mouth daily with breakfast.    . metroNIDAZOLE (METROGEL) 1 % gel Apply 1 application topically daily.    . mirtazapine (REMERON) 15 MG tablet     . omeprazole (PRILOSEC) 20 MG capsule Take 20 mg by mouth every morning.    Marland Kitchen PARoxetine (PAXIL) 40 MG tablet Take 40 mg by mouth daily.    . sennosides-docusate sodium (SENOKOT-S) 8.6-50 MG tablet Take 2 tablets by mouth daily.    . traMADol (ULTRAM) 50 MG  tablet     . dexamethasone (DECADRON) 4 MG tablet Take 5 tablets (20 mg total) by mouth once a week. With food (for plasma cell dyscrasia) 20 tablet 1   No current facility-administered medications for this visit.     REVIEW OF SYSTEMS:    10 Point review of Systems was done is negative except as noted above.  PHYSICAL EXAMINATION: ECOG PERFORMANCE STATUS: 3 - Symptomatic, >50% confined to bed  . Vitals:   08/28/16 1236  BP: (!) 138/49  Pulse: 66  Resp: 17  Temp: 98.6 F (37 C)   Filed Weights   08/28/16 1236  Weight: 96 lb 11.2 oz (43.9 kg)   .Body mass index is 17.46 kg/m.  GENERAL: Elderly frail Caucasian female who was hard of hearing. SKIN: thin skin with bilateral senile purpura.  EYES: Conjunctival with pallor noted, sclera clear OROPHARYNX: mmm NECK: supple, no JVD, thyroid normal size, non-tender, without nodularity LYMPH:  no palpable lymphadenopathy in the cervical, axillary or inguinal LUNGS: clear to auscultation with normal respiratory effort HEART: regular rate & rhythm,  no murmurs and no lower extremity edema ABDOMEN: abdomen soft, non-tender, normoactive bowel sounds , no palpable hepatosplenomegaly. Musculoskeletal: no cyanosis of digits and no clubbing  PSYCH: alert & oriented x 3 with fluent speech NEURO: no focal motor/sensory deficits  LABORATORY DATA:  I have reviewed the data as listed  . CBC Latest Ref Rng & Units 08/28/2016 08/14/2016 08/14/2016  WBC 3.9 - 10.3 10e3/uL 9.3 10.9(H) 9.3  Hemoglobin 11.6 - 15.9 g/dL 8.6(L) 9.3(L) 5.8(LL)  Hematocrit 34.8 - 46.6 % 27.1(L) 28.2(L) 19.4(L)  Platelets 145 - 400 10e3/uL 65(L) 60(L) 66(L)   . CBC    Component Value Date/Time   WBC 9.3 08/28/2016 1220   WBC 10.9 (H) 08/14/2016 1416   RBC 2.66 (L) 08/28/2016 1220   RBC 2.89 (L) 08/14/2016 1416   RBC 2.89 (L) 08/14/2016 1416   HGB 8.6 (L) 08/28/2016 1220   HCT 27.1 (L) 08/28/2016 1220   PLT 65 (L) 08/28/2016 1220   MCV 101.9 (H) 08/28/2016  1220   MCH 32.3 08/28/2016 1220   MCH 32.2 08/14/2016 1416   MCHC 31.7 08/28/2016 1220   MCHC 33.0 08/14/2016 1416   RDW 20.8 (H) 08/28/2016 1220   LYMPHSABS 4.0 (H) 08/28/2016 1220   MONOABS 1.5 (H) 08/28/2016 1220   EOSABS 0.1 08/28/2016 1220   BASOSABS 0.0 08/28/2016 1220    . CMP Latest Ref Rng & Units 08/14/2016 08/14/2016 06/20/2016  Glucose 70 - 140 mg/dl 127 - 94  BUN 7.0 - 26.0 mg/dL 35.5(H) - 34.2(H)  Creatinine 0.6 - 1.1 mg/dL 1.7(H) - 1.3(H)  Sodium 136 - 145 mEq/L 138 - 141  Potassium 3.5 - 5.1 mEq/L 4.5 - 4.6  Chloride 101 - 111 mmol/L - - -  CO2 22 - 29 mEq/L 22 - 22  Calcium 8.4 - 10.4 mg/dL 10.9(H) - 11.2(H)  Total Protein 6.0 -  8.5 g/dL 7.7 7.3 7.8  Total Bilirubin 0.20 - 1.20 mg/dL 1.52(H) - 1.33(H)  Alkaline Phos 40 - 150 U/L 122 - 129  AST 5 - 34 U/L 17 - 19  ALT 0 - 55 U/L 9 - 11   . Lab Results  Component Value Date   IRON 120 08/14/2016   TIBC 324 08/14/2016   IRONPCTSAT 37 08/14/2016   (Iron and TIBC)  Lab Results  Component Value Date   FERRITIN 387 (H) 08/14/2016   Component     Latest Ref Rng & Units 08/14/2016  IgG (Immunoglobin G), Serum     700 - 1600 mg/dL 1,642 (H)  IgA/Immunoglobulin A, Serum     64 - 422 mg/dL 158  IgM, Qn, Serum     26 - 217 mg/dL 54  Total Protein     6.0 - 8.5 g/dL 7.3  Albumin SerPl Elph-Mcnc     2.9 - 4.4 g/dL 4.1  Alpha 1     0.0 - 0.4 g/dL 0.3  Alpha2 Glob SerPl Elph-Mcnc     0.4 - 1.0 g/dL 0.6  B-Globulin SerPl Elph-Mcnc     0.7 - 1.3 g/dL 0.8  Gamma Glob SerPl Elph-Mcnc     0.4 - 1.8 g/dL 1.6  M Protein SerPl Elph-Mcnc     Not Observed g/dL 0.2 (H)  Globulin, Total     2.2 - 3.9 g/dL 3.2  Albumin/Glob SerPl     0.7 - 1.7 1.3  IFE 1      Comment  Please Note (HCV):      Comment  Iron     41 - 142 ug/dL 120  TIBC     236 - 444 ug/dL 324  UIBC     120 - 384 ug/dL 204  %SAT     21 - 57 % 37  Folate, Hemolysate     Not Estab. ng/mL 456.7  HCT     34.0 - 46.6 % 18.3 (L)  Folate, RBC      >498 ng/mL 2,496  Ig Kappa Free Light Chain     3.3 - 19.4 mg/L 441.7 (H)  Ig Lambda Free Light Chain     5.7 - 26.3 mg/L 16.4  Kappa/Lambda FluidC Ratio     0.26 - 1.65 26.93 (H)  Vitamin B12     232 - 1,245 pg/mL 655  LDH     125 - 245 U/L 245  Folate     >3.0 ng/mL 19.8  Ferritin     9 - 269 ng/ml 387 (H)  Erythropoietin     2.6 - 18.5 mIU/mL 100.6 (H)   RADIOGRAPHIC STUDIES: I have personally reviewed the radiological images as listed and agreed with the findings in the report. No results found.  ASSESSMENT & PLAN:   83 year old frail-appearing elderly Caucasian female referred for  #1 Macrocytic severe anemia last available labs from 04/27/2016 showed a hemoglobin of 7.1 with an MCV of 113.7 hgb today is down to 5.8 and patient is symptomatic. #2 Moderate thrombocytopenia. Platelet count of 78k increased bruising but no overt bleeding at this time. This was labs on 05/05/2016. Platelets are now down to 60k.  Rpt labs today show macrocytic anemia hgb 7.2 with MCV of 113.2 B12 and folate WNL PBS -concerning for findings of MDS. Monocytosis might suggest CMML  SPEP showed a M spike with only 0.2gm/dl of IgG kappa monoclonal protein however she has significant elevation of Ig Kappa FLC and K/L ratio of 27  concerning for myeloma especially with hypercalcemia and some bump in renal function. PLAN -Hemoglobin is 8.6. No acute indication for transfusion at this time. -I discussed the findings of her blood testing with the patient and her son. We discussed the concern for likely multiple myeloma. And also rediscussed goals of cares - patient and her son have decided not to pursue additional evaluation with BM Bx or aggressive treatments.  -they would like to pursue supportive care with transfusions as needed for symptomatic anemia. -Patient and her son are okay with the idea of using dexamethasone weekly to try to conservatively treat likely paraproteinemia. -We'll start the  patient on dexamethasone 20 mg weekly with food. -continue empiric replacement with B complex and B12   RTC with Dr Irene Limbo in 3weeks with rpt labs and with son.  All of the patients questions were answered with apparent satisfaction. The patient knows to call the clinic with any problems, questions or concerns.  I spent 20 minutes counseling the patient face to face. The total time spent in the appointment was 25 minutes and more than 50% was on counseling and direct patient cares.    Sullivan Lone MD [place] AAHIVMS [facility] Hematology/Oncology Physician [facility]  (Office):       [phone] (Work cell):  [phone] (Fax):           [phone]

## 2016-09-17 ENCOUNTER — Telehealth: Payer: Self-pay | Admitting: Hematology

## 2016-09-17 NOTE — Telephone Encounter (Signed)
Son called and wanted to reschedule patients appointment for tormorrow to next week  Call back # [phone]

## 2016-09-18 ENCOUNTER — Ambulatory Visit: Payer: Medicare Other | Admitting: Hematology

## 2016-09-18 ENCOUNTER — Other Ambulatory Visit: Payer: Medicare Other

## 2016-09-24 ENCOUNTER — Telehealth: Payer: Self-pay | Admitting: Hematology

## 2016-09-24 NOTE — Telephone Encounter (Signed)
Patient's son called to reschedule patient's missed 3.13.18 appointments. Rescheduled to next available.

## 2016-10-19 ENCOUNTER — Inpatient Hospital Stay (HOSPITAL_COMMUNITY)
Admission: EM | Admit: 2016-10-19 | Discharge: 2016-10-23 | DRG: 871 | Disposition: A | Discharge status: Hospice | Payer: Medicare Other | Attending: Internal Medicine | Admitting: Internal Medicine

## 2016-10-19 ENCOUNTER — Emergency Department (HOSPITAL_COMMUNITY): Payer: Medicare Other

## 2016-10-19 ENCOUNTER — Encounter (HOSPITAL_COMMUNITY): Payer: Self-pay

## 2016-10-19 DIAGNOSIS — Y92129 Unspecified place in nursing home as the place of occurrence of the external cause: Secondary | ICD-10-CM | POA: Diagnosis not present

## 2016-10-19 DIAGNOSIS — R748 Abnormal levels of other serum enzymes: Secondary | ICD-10-CM | POA: Diagnosis not present

## 2016-10-19 DIAGNOSIS — R778 Other specified abnormalities of plasma proteins: Secondary | ICD-10-CM

## 2016-10-19 DIAGNOSIS — R945 Abnormal results of liver function studies: Secondary | ICD-10-CM

## 2016-10-19 DIAGNOSIS — E870 Hyperosmolality and hypernatremia: Secondary | ICD-10-CM | POA: Diagnosis present

## 2016-10-19 DIAGNOSIS — R0989 Other specified symptoms and signs involving the circulatory and respiratory systems: Secondary | ICD-10-CM | POA: Diagnosis present

## 2016-10-19 DIAGNOSIS — R17 Unspecified jaundice: Secondary | ICD-10-CM | POA: Diagnosis present

## 2016-10-19 DIAGNOSIS — E875 Hyperkalemia: Secondary | ICD-10-CM

## 2016-10-19 DIAGNOSIS — I214 Non-ST elevation (NSTEMI) myocardial infarction: Secondary | ICD-10-CM | POA: Diagnosis present

## 2016-10-19 DIAGNOSIS — Z882 Allergy status to sulfonamides status: Secondary | ICD-10-CM | POA: Diagnosis not present

## 2016-10-19 DIAGNOSIS — Z90710 Acquired absence of both cervix and uterus: Secondary | ICD-10-CM

## 2016-10-19 DIAGNOSIS — E876 Hypokalemia: Secondary | ICD-10-CM | POA: Diagnosis present

## 2016-10-19 DIAGNOSIS — Z79899 Other long term (current) drug therapy: Secondary | ICD-10-CM | POA: Diagnosis not present

## 2016-10-19 DIAGNOSIS — E872 Acidosis, unspecified: Secondary | ICD-10-CM | POA: Diagnosis present

## 2016-10-19 DIAGNOSIS — A419 Sepsis, unspecified organism: Principal | ICD-10-CM | POA: Diagnosis present

## 2016-10-19 DIAGNOSIS — Z681 Body mass index (BMI) 19 or less, adult: Secondary | ICD-10-CM | POA: Diagnosis not present

## 2016-10-19 DIAGNOSIS — I252 Old myocardial infarction: Secondary | ICD-10-CM

## 2016-10-19 DIAGNOSIS — R4781 Slurred speech: Secondary | ICD-10-CM | POA: Diagnosis not present

## 2016-10-19 DIAGNOSIS — D649 Anemia, unspecified: Secondary | ICD-10-CM | POA: Diagnosis not present

## 2016-10-19 DIAGNOSIS — I10 Essential (primary) hypertension: Secondary | ICD-10-CM | POA: Diagnosis present

## 2016-10-19 DIAGNOSIS — J811 Chronic pulmonary edema: Secondary | ICD-10-CM | POA: Diagnosis present

## 2016-10-19 DIAGNOSIS — R579 Shock, unspecified: Secondary | ICD-10-CM | POA: Diagnosis present

## 2016-10-19 DIAGNOSIS — W19XXXA Unspecified fall, initial encounter: Secondary | ICD-10-CM | POA: Diagnosis present

## 2016-10-19 DIAGNOSIS — R7989 Other specified abnormal findings of blood chemistry: Secondary | ICD-10-CM | POA: Diagnosis not present

## 2016-10-19 DIAGNOSIS — R740 Nonspecific elevation of levels of transaminase and lactic acid dehydrogenase [LDH]: Secondary | ICD-10-CM | POA: Diagnosis present

## 2016-10-19 DIAGNOSIS — K219 Gastro-esophageal reflux disease without esophagitis: Secondary | ICD-10-CM | POA: Diagnosis present

## 2016-10-19 DIAGNOSIS — J189 Pneumonia, unspecified organism: Secondary | ICD-10-CM | POA: Diagnosis present

## 2016-10-19 DIAGNOSIS — Z885 Allergy status to narcotic agent status: Secondary | ICD-10-CM

## 2016-10-19 DIAGNOSIS — I248 Other forms of acute ischemic heart disease: Secondary | ICD-10-CM | POA: Diagnosis present

## 2016-10-19 DIAGNOSIS — R6521 Severe sepsis with septic shock: Secondary | ICD-10-CM | POA: Diagnosis present

## 2016-10-19 DIAGNOSIS — F0390 Unspecified dementia without behavioral disturbance: Secondary | ICD-10-CM | POA: Diagnosis present

## 2016-10-19 DIAGNOSIS — S80211A Abrasion, right knee, initial encounter: Secondary | ICD-10-CM | POA: Diagnosis present

## 2016-10-19 DIAGNOSIS — Z888 Allergy status to other drugs, medicaments and biological substances status: Secondary | ICD-10-CM | POA: Diagnosis not present

## 2016-10-19 DIAGNOSIS — M810 Age-related osteoporosis without current pathological fracture: Secondary | ICD-10-CM | POA: Diagnosis present

## 2016-10-19 DIAGNOSIS — Z515 Encounter for palliative care: Secondary | ICD-10-CM | POA: Diagnosis not present

## 2016-10-19 DIAGNOSIS — I129 Hypertensive chronic kidney disease with stage 1 through stage 4 chronic kidney disease, or unspecified chronic kidney disease: Secondary | ICD-10-CM | POA: Diagnosis present

## 2016-10-19 DIAGNOSIS — Z9049 Acquired absence of other specified parts of digestive tract: Secondary | ICD-10-CM

## 2016-10-19 DIAGNOSIS — N179 Acute kidney failure, unspecified: Secondary | ICD-10-CM | POA: Diagnosis present

## 2016-10-19 DIAGNOSIS — Z880 Allergy status to penicillin: Secondary | ICD-10-CM | POA: Diagnosis not present

## 2016-10-19 DIAGNOSIS — Z66 Do not resuscitate: Secondary | ICD-10-CM | POA: Diagnosis present

## 2016-10-19 DIAGNOSIS — D539 Nutritional anemia, unspecified: Secondary | ICD-10-CM | POA: Diagnosis present

## 2016-10-19 DIAGNOSIS — R636 Underweight: Secondary | ICD-10-CM | POA: Diagnosis present

## 2016-10-19 DIAGNOSIS — D72829 Elevated white blood cell count, unspecified: Secondary | ICD-10-CM | POA: Diagnosis not present

## 2016-10-19 DIAGNOSIS — N184 Chronic kidney disease, stage 4 (severe): Secondary | ICD-10-CM | POA: Diagnosis present

## 2016-10-19 DIAGNOSIS — R509 Fever, unspecified: Secondary | ICD-10-CM

## 2016-10-19 DIAGNOSIS — D696 Thrombocytopenia, unspecified: Secondary | ICD-10-CM | POA: Diagnosis present

## 2016-10-19 DIAGNOSIS — D469 Myelodysplastic syndrome, unspecified: Secondary | ICD-10-CM | POA: Diagnosis present

## 2016-10-19 HISTORY — DX: Encounter for other specified aftercare: Z51.89

## 2016-10-19 LAB — COMPREHENSIVE METABOLIC PANEL
ALBUMIN: 3.7 g/dL (ref 3.5–5.0)
ALT: 123 U/L — ABNORMAL HIGH (ref 14–54)
ANION GAP: 19 — AB (ref 5–15)
AST: 225 U/L — AB (ref 15–41)
Alkaline Phosphatase: 102 U/L (ref 38–126)
BILIRUBIN TOTAL: 2.4 mg/dL — AB (ref 0.3–1.2)
BUN: 61 mg/dL — AB (ref 6–20)
CHLORIDE: 110 mmol/L (ref 101–111)
CO2: 13 mmol/L — AB (ref 22–32)
Calcium: 10.3 mg/dL (ref 8.9–10.3)
Creatinine, Ser: 2.52 mg/dL — ABNORMAL HIGH (ref 0.44–1.00)
GFR calc Af Amer: 18 mL/min — ABNORMAL LOW (ref >60)
GFR calc non Af Amer: 15 mL/min — ABNORMAL LOW (ref >60)
GLUCOSE: 121 mg/dL — AB (ref 65–99)
POTASSIUM: 5.8 mmol/L — AB (ref 3.5–5.1)
SODIUM: 142 mmol/L (ref 135–145)
TOTAL PROTEIN: 6.8 g/dL (ref 6.5–8.1)

## 2016-10-19 LAB — CBC WITH DIFFERENTIAL/PLATELET
BASOS PCT: 0 %
Basophils Absolute: 0 10*3/uL (ref 0.0–0.1)
EOS PCT: 0 %
Eosinophils Absolute: 0 10*3/uL (ref 0.0–0.7)
HEMATOCRIT: 17.5 % — AB (ref 36.0–46.0)
HEMOGLOBIN: 5.4 g/dL — AB (ref 12.0–15.0)
LYMPHS ABS: 7.5 10*3/uL — AB (ref 0.7–4.0)
LYMPHS PCT: 43 %
MCH: 35.3 pg — AB (ref 26.0–34.0)
MCHC: 30.9 g/dL (ref 30.0–36.0)
MCV: 114.4 fL — ABNORMAL HIGH (ref 78.0–100.0)
MONOS PCT: 13 %
Monocytes Absolute: 2.3 10*3/uL — ABNORMAL HIGH (ref 0.1–1.0)
NEUTROS ABS: 7.6 10*3/uL (ref 1.7–7.7)
Neutrophils Relative %: 44 %
Platelets: 61 10*3/uL — ABNORMAL LOW (ref 150–400)
RBC: 1.53 MIL/uL — ABNORMAL LOW (ref 3.87–5.11)
RDW: 22.7 % — ABNORMAL HIGH (ref 11.5–15.5)
WBC: 17.4 10*3/uL — ABNORMAL HIGH (ref 4.0–10.5)

## 2016-10-19 LAB — I-STAT TROPONIN, ED: TROPONIN I, POC: 0.15 ng/mL — AB (ref 0.00–0.08)

## 2016-10-19 LAB — I-STAT CG4 LACTIC ACID, ED: LACTIC ACID, VENOUS: 8.33 mmol/L — AB (ref 0.5–1.9)

## 2016-10-19 LAB — PREPARE RBC (CROSSMATCH)

## 2016-10-19 MED ORDER — SODIUM CHLORIDE 0.9 % IV BOLUS (SEPSIS)
500.0000 mL | Freq: Once | INTRAVENOUS | Status: AC
  Administered 2016-10-19: 500 mL via INTRAVENOUS

## 2016-10-19 MED ORDER — SODIUM CHLORIDE 0.9 % IV SOLN
500.0000 mg | INTRAVENOUS | Status: DC
  Administered 2016-10-21: 500 mg via INTRAVENOUS
  Filled 2016-10-19: qty 500

## 2016-10-19 MED ORDER — DEXTROSE 5 % IV SOLN
1.0000 g | Freq: Once | INTRAVENOUS | Status: AC
  Administered 2016-10-19: 1 g via INTRAVENOUS
  Filled 2016-10-19: qty 1

## 2016-10-19 MED ORDER — DEXTROSE 5 % IV SOLN: 2.0000 g | Freq: Once | INTRAVENOUS | Status: DC

## 2016-10-19 MED ORDER — CEFEPIME HCL 2 G IJ SOLR
500.0000 mg | INTRAMUSCULAR | Status: DC
  Administered 2016-10-20 – 2016-10-21 (×2): 500 mg via INTRAVENOUS
  Filled 2016-10-19 (×2): qty 0.5

## 2016-10-19 MED ORDER — VANCOMYCIN HCL IN DEXTROSE 750-5 MG/150ML-% IV SOLN
750.0000 mg | Freq: Once | INTRAVENOUS | Status: AC
  Administered 2016-10-19: 750 mg via INTRAVENOUS
  Filled 2016-10-19: qty 150

## 2016-10-19 MED ORDER — SODIUM CHLORIDE 0.9 % IV SOLN
Freq: Once | INTRAVENOUS | Status: DC
  Administered 2016-10-20: 03:00:00 via INTRAVENOUS

## 2016-10-19 MED ORDER — SODIUM CHLORIDE 0.9 % IV BOLUS (SEPSIS)
1000.0000 mL | Freq: Once | INTRAVENOUS | Status: AC
  Administered 2016-10-19: 1000 mL via INTRAVENOUS

## 2016-10-19 NOTE — Progress Notes (Signed)
Pharmacy Antibiotic Note  Carolyn Odonnell is a 83 y.o. female admitted on 10/19/2016 with sepsis.  Pharmacy has been consulted for vancomycin and cefepime dosing.  Vancomycin trough goal 15-20  Plan: 1) Vancomycin 750mg  IV x 1 then 500mg  IV q48 2) Cefepime 1g IV x 1 then 500mg  IV q24 3) Follow renal function, cultures, LOT, level if needed  Weight: 96 lb 12.5 oz (43.9 kg)  Temp (24hrs), Avg:99.3 F (37.4 C), Min:99.3 F (37.4 C), Max:99.3 F (37.4 C)   Recent Labs Lab 10/19/16 2141  LATICACIDVEN 8.33*    CrCl cannot be calculated (Patient's most recent lab result is older than the maximum 21 days allowed.).    Allergies  Allergen Reactions  . Zolpidem Other (See Comments)    Causing falls, disorientation, sleep walking events  . Codeine Other (See Comments)    unknown  . Doxycycline Other (See Comments)    unknown  . Fosamax [Alendronate Sodium] Other (See Comments)    unknown  . Morphine And Related Other (See Comments)    unknown  . Novocain [Procaine Hcl] Other (See Comments)    unknown  . Penicillins Other (See Comments)    unknown  . Percocet [Oxycodone-Acetaminophen] Other (See Comments)    unknown  . Skelaxin [Metaxalone] Other (See Comments)    unknown  . Sulfa Antibiotics Rash    Antimicrobials this admission: 4/13 Vancomycin >> 4/13 Cefepime >>  Dose adjustments this admission: n/a  Microbiology results: 4/13 blood x2>>  Thank you for allowing pharmacy to be a part of this patient's care.  Deboraha Sprang 10/19/2016 9:57 PM

## 2016-10-19 NOTE — ED Notes (Signed)
Per blood bank, pt positive for antibodies. Will take longer to match patient to blood, Dr. Dayna Barker aware

## 2016-10-19 NOTE — ED Notes (Signed)
ED Provider at bedside. 

## 2016-10-19 NOTE — ED Notes (Addendum)
Per blood bank will be approx 2 hours until blood match ready, Dr. Dayna Barker aware.

## 2016-10-19 NOTE — ED Provider Notes (Signed)
[facility] DEPT Provider Note   CSN: [id number] Arrival date & time: 10/19/16  2107     History   Chief Complaint Chief Complaint  Patient presents with  . Weakness  . Fall    HPI Carolyn Odonnell is a 83 y.o. female.   Fall  This is a new problem. The current episode started 1 to 2 hours ago. The problem occurs constantly. The problem has been resolved. Associated symptoms comments: Left knee pain. Nothing aggravates the symptoms. Nothing relieves the symptoms. She has tried nothing for the symptoms.    Past Medical History:  Diagnosis Date  . Anxiety   . Arthritis   . Chronic headache   . Constipation   . Depression   . GERD (gastroesophageal reflux disease)   . Hypertension   . Osteoporosis     Patient Active Problem List   Diagnosis Date Noted  . CKD (chronic kidney disease), stage IV ([place]) 10/19/2016  . AKI (acute kidney injury) ([place]) 10/19/2016  . Slurred speech 10/19/2016  . Hyperkalemia 10/19/2016  . NSTEMI (non-ST elevated myocardial infarction) ([place]) 10/19/2016  . Symptomatic anemia 10/19/2016  . Transfusion-dependent anemia 10/19/2016  . Thrombocytopenia ([place]) 10/19/2016  . Lactic acidosis 10/19/2016  . Syncope 12/23/2011  . Leucocytosis 12/23/2011  . Thoracic spine fracture ([place]) 12/23/2011  . HTN (hypertension) 12/23/2011    Past Surgical History:  Procedure Laterality Date  . ABDOMINAL HYSTERECTOMY    . CHOLECYSTECTOMY    . COLON SURGERY      OB History    No data available       Home Medications    Prior to Admission medications   Medication Sig Start Date End Date Taking? Authorizing Provider  amLODipine-benazepril (LOTREL) 10-40 MG per capsule Take 1 capsule by mouth every morning.    Historical Provider, MD  atenolol (TENORMIN) 25 MG tablet Take 50 mg by mouth every morning.     Historical Provider, MD  b complex vitamins capsule Take 1 capsule by mouth daily. 08/14/16   Brunetta Genera, MD  cholecalciferol (VITAMIN D) 1000  UNITS tablet Take 2,000 Units by mouth every morning.    Historical Provider, MD  Cyanocobalamin (B-12) 1000 MCG SUBL Place 1,000 mcg under the tongue daily. 08/14/16   Brunetta Genera, MD  dexamethasone (DECADRON) 4 MG tablet Take 5 tablets (20 mg total) by mouth once a week. With food (for plasma cell dyscrasia) 08/28/16   Brunetta Genera, MD  ENSURE (ENSURE) Take 237 mLs by mouth 3 (three) times daily between meals.    Historical Provider, MD  ferrous sulfate 325 (65 FE) MG tablet Take 325 mg by mouth daily with breakfast.    Historical Provider, MD  metroNIDAZOLE (METROGEL) 1 % gel Apply 1 application topically daily.    Historical Provider, MD  mirtazapine (REMERON) 15 MG tablet  06/09/16   Historical Provider, MD  omeprazole (PRILOSEC) 20 MG capsule Take 20 mg by mouth every morning.    Historical Provider, MD  PARoxetine (PAXIL) 40 MG tablet Take 40 mg by mouth daily. 02/04/15   Historical Provider, MD  sennosides-docusate sodium (SENOKOT-S) 8.6-50 MG tablet Take 2 tablets by mouth daily.    Historical Provider, MD  traMADol Veatrice Bourbon) 50 MG tablet  06/08/16   Historical Provider, MD    Family History History reviewed. No pertinent family history.  Social History Social History  Substance Use Topics  . Smoking status: Never Smoker  . Smokeless tobacco: Never Used  . Alcohol use No  Allergies   Zolpidem; Codeine; Doxycycline; Fosamax [alendronate sodium]; Morphine and related; Novocain [procaine hcl]; Penicillins; Percocet [oxycodone-acetaminophen]; Skelaxin [metaxalone]; and Sulfa antibiotics   Review of Systems Review of Systems  Unable to perform ROS: Dementia     Physical Exam Updated Vital Signs BP (!) 122/55   Pulse 89   Temp 99.3 F (37.4 C) (Rectal)   Resp (!) 34   Wt 96 lb 12.5 oz (43.9 kg)   SpO2 100%   BMI 17.48 kg/m   Physical Exam  Constitutional: She appears cachectic. She appears distressed.  HENT:  Head: Normocephalic and atraumatic.  Eyes:  EOM are normal. Pupils are equal, round, and reactive to light. Right eye exhibits no discharge. Left eye exhibits no discharge.  Pale conjunctiva  Neck: Normal range of motion.  Cardiovascular: Normal rate and regular rhythm.   Pulmonary/Chest: No stridor. Tachypnea noted. No respiratory distress. She has no decreased breath sounds. She has no wheezes. She has no rhonchi.  Abdominal: She exhibits distension (mildly). There is no tenderness (winced in periumbilical area initially but did not have ttp wiith multiple palpations afterwards).  Neurological: She is alert.  Skin: There is pallor.  Jaundice  Nursing note and vitals reviewed.    ED Treatments / Results  Labs (all labs ordered are listed, but only abnormal results are displayed) Labs Reviewed  COMPREHENSIVE METABOLIC PANEL - Abnormal; Notable for the following:       Result Value   Potassium 5.8 (*)    CO2 13 (*)    Glucose, Bld 121 (*)    BUN 61 (*)    Creatinine, Ser 2.52 (*)    AST 225 (*)    ALT 123 (*)    Total Bilirubin 2.4 (*)    GFR calc non Af Amer 15 (*)    GFR calc Af Amer 18 (*)    Anion gap 19 (*)    All other components within normal limits  CBC WITH DIFFERENTIAL/PLATELET - Abnormal; Notable for the following:    WBC 17.4 (*)    RBC 1.53 (*)    Hemoglobin 5.4 (*)    HCT 17.5 (*)    MCV 114.4 (*)    MCH 35.3 (*)    RDW 22.7 (*)    Platelets 61 (*)    Lymphs Abs 7.5 (*)    Monocytes Absolute 2.3 (*)    All other components within normal limits  I-STAT CG4 LACTIC ACID, ED - Abnormal; Notable for the following:    Lactic Acid, Venous 8.33 (*)    All other components within normal limits  I-STAT TROPOININ, ED - Abnormal; Notable for the following:    Troponin i, poc 0.15 (*)    All other components within normal limits  CULTURE, BLOOD (ROUTINE X 2)  CULTURE, BLOOD (ROUTINE X 2)  URINALYSIS, ROUTINE W REFLEX MICROSCOPIC  LACTIC ACID, PLASMA  LACTIC ACID, PLASMA  TROPONIN I  POTASSIUM  POTASSIUM    POTASSIUM  POTASSIUM  POTASSIUM  POTASSIUM  TROPONIN I  TROPONIN I  TROPONIN I  SODIUM, URINE, RANDOM  CREATININE, URINE, RANDOM  TYPE AND SCREEN  PREPARE RBC (CROSSMATCH)    EKG  EKG Interpretation  Date/Time:  Friday October 19 2016 21:33:28 EDT Ventricular Rate:  74 PR Interval:    QRS Duration: 91 QT Interval:  432 QTC Calculation: 480 R Axis:   -59 Text Interpretation:  Sinus rhythm Left anterior fascicular block Anteroseptal infarct, age indeterminate ST depressions new V1-2 changes c/w previous in  2016 Confirmed by [facility] MD, Corene Cornea [phone]) on 10/19/2016 9:39:34 PM       Radiology Dg Chest 2 View  Result Date: 10/19/2016 CLINICAL DATA:  Status post fall, with tachypnea and shallow respirations. Decreased O2 saturation. Initial encounter. EXAM: CHEST  2 VIEW COMPARISON:  Chest radiograph performed 12/23/2011 FINDINGS: The lungs are well-aerated. Mild vascular congestion is noted. Mild central airspace opacities could reflect infection or interstitial edema. A small left pleural effusion is noted. There is no evidence of pneumothorax. The heart is borderline enlarged. No acute osseous abnormalities are seen. IMPRESSION: Mild vascular congestion and borderline cardiomegaly. Mild central airspace opacities could reflect infection or interstitial edema. Small left pleural effusion. Electronically Signed   By: Garald Balding M.D.   On: 10/19/2016 22:16   Ct Head Wo Contrast  Result Date: 10/19/2016 CLINICAL DATA:  Patient fell last night with confusion and slurring of speech this morning EXAM: CT HEAD WITHOUT CONTRAST TECHNIQUE: Contiguous axial images were obtained from the base of the skull through the vertex without intravenous contrast. COMPARISON:  Head CT from 02/04/2015 FINDINGS: BRAIN: There is sulcal and ventricular prominence consistent with superficial and central atrophy. No intraparenchymal hemorrhage, mass effect nor midline shift. Periventricular and subcortical white  matter hypodensities consistent with chronic small vessel ischemic disease are identified. No acute large vascular territory infarcts. No abnormal extra-axial fluid collections. Basal cisterns are not effaced and midline. VASCULAR: Moderate calcific atherosclerosis of the carotid siphons. SKULL: No skull fracture. No significant scalp soft tissue swelling. Degenerate changes of the skullbase and upper cervical spine. SINUSES/ORBITS: The mastoid air-cells are clear. The included paranasal sinuses are well-aerated.The included ocular globes and orbital contents are non-suspicious. Bilateral lens replacements surgeries are noted. OTHER: None. IMPRESSION: Atrophy with chronic small vessel ischemic disease of periventricular white matter. No acute intracranial abnormality is noted. Electronically Signed   By: Ashley Royalty M.D.   On: 10/19/2016 22:36   Dg Knee Complete 4 Views Left  Result Date: 10/19/2016 CLINICAL DATA:  Fall with knee pain EXAM: LEFT KNEE - COMPLETE 4+ VIEW COMPARISON:  None. FINDINGS: The bones are osteopenic. There is no acute fracture or dislocation at the left knee. There is mild chondrocalcinosis within the vertebral joint spaces. Prominent vascular calcifications are noted. No knee effusion. IMPRESSION: No acute fracture or dislocation of the left knee. Medial and lateral femorotibial compartment chondrocalcinosis. Electronically Signed   By: Ulyses Jarred M.D.   On: 10/19/2016 22:13    Procedures Procedures (including critical care time)  CRITICAL CARE Performed by: Merrily Pew Total critical care time: 35 minutes Critical care time was exclusive of separately billable procedures and treating other patients. Critical care was necessary to treat or prevent imminent or life-threatening deterioration. Critical care was time spent personally by me on the following activities: development of treatment plan with patient and/or surrogate as well as nursing, discussions with consultants,  evaluation of patient's response to treatment, examination of patient, obtaining history from patient or surrogate, ordering and performing treatments and interventions, ordering and review of laboratory studies, ordering and review of radiographic studies, pulse oximetry and re-evaluation of patient's condition.   Medications Ordered in ED Medications  ceFEPIme (MAXIPIME) 500 mg in dextrose 5 % 50 mL IVPB (not administered)  vancomycin (VANCOCIN) 500 mg in sodium chloride 0.9 % 100 mL IVPB (not administered)  B-12 SUBL 1,000 mcg (not administered)  mirtazapine (REMERON) tablet 15 mg (not administered)  sennosides-docusate sodium (SENOKOT-S) 8.6-50 MG tablet 2 tablet (not administered)  ENSURE (  ENSURE) liquid 237 mL (not administered)  PARoxetine (PAXIL) tablet 40 mg (not administered)  cholecalciferol (VITAMIN D) tablet 2,000 Units (not administered)  nitroGLYCERIN (NITROSTAT) SL tablet 0.4 mg (not administered)  ondansetron (ZOFRAN) injection 4 mg (not administered)  0.9 %  sodium chloride infusion (not administered)  ALPRAZolam (XANAX) tablet 0.25 mg (not administered)  furosemide (LASIX) injection 20 mg (not administered)  pantoprazole (PROTONIX) injection 40 mg (not administered)  sodium chloride 0.9 % bolus 1,000 mL (0 mLs Intravenous Stopped 10/19/16 2242)    And  sodium chloride 0.9 % bolus 500 mL (0 mLs Intravenous Stopped 10/19/16 2304)  vancomycin (VANCOCIN) IVPB 750 mg/150 ml premix (750 mg Intravenous Given 10/19/16 2224)  ceFEPIme (MAXIPIME) 1 g in dextrose 5 % 50 mL IVPB (1 g Intravenous New Bag/Given 10/19/16 2240)     Initial Impression / Assessment and Plan / ED Course  I have reviewed the triage vital signs and the nursing notes.  Pertinent labs & imaging results that were available during my care of the patient were reviewed by me and considered in my medical decision making (see chart for details).     Chronically ill patient with dementia with a fal 2/2 weakness  likely 2/2 anemia. Multiple lab abnormalities c/w multi-organ failure likely related to anemia but has multiple antibodies so will take a while to get blood. Family wants her to be full code.   Final Clinical Impressions(s) / ED Diagnoses   Final diagnoses:  Shock ([place])  Elevated liver function tests  Anemia, unspecified type  Leukocytosis, unspecified type  AKI (acute kidney injury) ([place])  Elevated troponin    New Prescriptions New Prescriptions   No medications on file     Merrily Pew, MD 10/20/16 [phone]

## 2016-10-19 NOTE — ED Notes (Signed)
Admitting and ED provider at bedside.

## 2016-10-19 NOTE — ED Triage Notes (Addendum)
Pt from [place] s/p fall that occurred earlier today. Per EMS, pt was placed back in bed by staff when 30 minutes later she reported R knee pain. Per EMS, on arrival pt tachypneic with shallow clear respirations, 88% on RA, 96% on 2L [place]. Staff also reports pt "seemed to be harder to understand" at 6 AM with slurred speech. LSW last night. Pt frequently receives transfusion for hx of anemia. Pt pale and cool to the touch. A&Ox3.

## 2016-10-19 NOTE — ED Notes (Signed)
Elevated CG-4 and I-stat trop reported to Dr. Dolly Rias

## 2016-10-19 NOTE — ED Notes (Signed)
Patient transported to XR. 

## 2016-10-19 NOTE — ED Notes (Signed)
EDP at bedside  

## 2016-10-19 NOTE — ED Notes (Addendum)
Blood consent signed by pt son and POA, pt verbally consented to blood transfusion. This nurse witnessed consent.

## 2016-10-20 ENCOUNTER — Inpatient Hospital Stay (HOSPITAL_COMMUNITY): Payer: Medicare Other

## 2016-10-20 ENCOUNTER — Encounter (HOSPITAL_COMMUNITY): Payer: Self-pay | Admitting: Family Medicine

## 2016-10-20 DIAGNOSIS — R778 Other specified abnormalities of plasma proteins: Secondary | ICD-10-CM

## 2016-10-20 DIAGNOSIS — R748 Abnormal levels of other serum enzymes: Secondary | ICD-10-CM

## 2016-10-20 DIAGNOSIS — N184 Chronic kidney disease, stage 4 (severe): Secondary | ICD-10-CM

## 2016-10-20 DIAGNOSIS — E872 Acidosis: Secondary | ICD-10-CM

## 2016-10-20 DIAGNOSIS — D649 Anemia, unspecified: Secondary | ICD-10-CM

## 2016-10-20 DIAGNOSIS — Z515 Encounter for palliative care: Secondary | ICD-10-CM

## 2016-10-20 DIAGNOSIS — R7989 Other specified abnormal findings of blood chemistry: Secondary | ICD-10-CM

## 2016-10-20 DIAGNOSIS — R945 Abnormal results of liver function studies: Secondary | ICD-10-CM

## 2016-10-20 DIAGNOSIS — N179 Acute kidney failure, unspecified: Secondary | ICD-10-CM

## 2016-10-20 LAB — TROPONIN I
Troponin I: 0.09 ng/mL (ref <0.03)
Troponin I: 0.1 ng/mL (ref <0.03)
Troponin I: 0.1 ng/mL (ref <0.03)
Troponin I: 0.13 ng/mL (ref <0.03)

## 2016-10-20 LAB — CBC
HCT: 22.2 % — ABNORMAL LOW (ref 36.0–46.0)
HEMOGLOBIN: 7.1 g/dL — AB (ref 12.0–15.0)
MCH: 33.6 pg (ref 26.0–34.0)
MCHC: 32 g/dL (ref 30.0–36.0)
MCV: 105.2 fL — AB (ref 78.0–100.0)
PLATELETS: 62 10*3/uL — AB (ref 150–400)
RBC: 2.11 MIL/uL — AB (ref 3.87–5.11)
RDW: 25.5 % — AB (ref 11.5–15.5)
WBC: 24.9 10*3/uL — AB (ref 4.0–10.5)

## 2016-10-20 LAB — HEMOGLOBIN AND HEMATOCRIT, BLOOD
HCT: 25.5 % — ABNORMAL LOW (ref 36.0–46.0)
HEMOGLOBIN: 8.5 g/dL — AB (ref 12.0–15.0)

## 2016-10-20 LAB — POTASSIUM
POTASSIUM: 5.7 mmol/L — AB (ref 3.5–5.1)
POTASSIUM: 5.4 mmol/L — AB (ref 3.5–5.1)
POTASSIUM: 5.2 mmol/L — AB (ref 3.5–5.1)
POTASSIUM: 3.9 mmol/L (ref 3.5–5.1)
Potassium: 5.1 mmol/L (ref 3.5–5.1)

## 2016-10-20 LAB — URINALYSIS, ROUTINE W REFLEX MICROSCOPIC
BACTERIA UA: NONE SEEN
BILIRUBIN URINE: NEGATIVE
Glucose, UA: NEGATIVE mg/dL
Hgb urine dipstick: NEGATIVE
Ketones, ur: NEGATIVE mg/dL
Leukocytes, UA: NEGATIVE
Nitrite: NEGATIVE
PROTEIN: 30 mg/dL — AB
SPECIFIC GRAVITY, URINE: 1.016 (ref 1.005–1.030)
SQUAMOUS EPITHELIAL / LPF: NONE SEEN
pH: 5 (ref 5.0–8.0)

## 2016-10-20 LAB — CREATININE, URINE, RANDOM: CREATININE, URINE: 118.23 mg/dL

## 2016-10-20 LAB — LACTIC ACID, PLASMA
LACTIC ACID, VENOUS: 3.5 mmol/L — AB (ref 0.5–1.9)
LACTIC ACID, VENOUS: 5.7 mmol/L — AB (ref 0.5–1.9)

## 2016-10-20 LAB — MRSA PCR SCREENING: MRSA by PCR: NEGATIVE

## 2016-10-20 LAB — SODIUM, URINE, RANDOM: SODIUM UR: 11 mmol/L

## 2016-10-20 MED ORDER — NITROGLYCERIN 0.4 MG SL SUBL: 0.4000 mg | SUBLINGUAL_TABLET | SUBLINGUAL | Status: DC | PRN

## 2016-10-20 MED ORDER — SODIUM POLYSTYRENE SULFONATE 15 GM/60ML PO SUSP
30.0000 g | Freq: Once | ORAL | Status: AC
  Administered 2016-10-20: 30 g via ORAL
  Filled 2016-10-20: qty 120

## 2016-10-20 MED ORDER — VITAMIN D 1000 UNITS PO TABS
2000.0000 [IU] | ORAL_TABLET | Freq: Every morning | ORAL | Status: DC
  Administered 2016-10-20 – 2016-10-22 (×3): 2000 [IU] via ORAL
  Filled 2016-10-20 (×3): qty 2

## 2016-10-20 MED ORDER — FUROSEMIDE 10 MG/ML IJ SOLN
20.0000 mg | Freq: Once | INTRAMUSCULAR | Status: AC
  Administered 2016-10-20: 20 mg via INTRAVENOUS
  Filled 2016-10-20: qty 2

## 2016-10-20 MED ORDER — PAROXETINE HCL 20 MG PO TABS
40.0000 mg | ORAL_TABLET | Freq: Every day | ORAL | Status: DC
  Administered 2016-10-20 – 2016-10-23 (×4): 40 mg via ORAL
  Filled 2016-10-20 (×4): qty 2

## 2016-10-20 MED ORDER — SENNOSIDES-DOCUSATE SODIUM 8.6-50 MG PO TABS
2.0000 | ORAL_TABLET | Freq: Every day | ORAL | Status: DC
  Administered 2016-10-21 – 2016-10-22 (×2): 2 via ORAL
  Filled 2016-10-20 (×3): qty 2

## 2016-10-20 MED ORDER — ENSURE ENLIVE PO LIQD
237.0000 mL | Freq: Three times a day (TID) | ORAL | Status: DC
  Administered 2016-10-21: 237 mL via ORAL

## 2016-10-20 MED ORDER — PANTOPRAZOLE SODIUM 40 MG IV SOLR
40.0000 mg | Freq: Two times a day (BID) | INTRAVENOUS | Status: DC
  Administered 2016-10-20 – 2016-10-22 (×6): 40 mg via INTRAVENOUS
  Filled 2016-10-20 (×6): qty 40

## 2016-10-20 MED ORDER — MIRTAZAPINE 15 MG PO TABS
15.0000 mg | ORAL_TABLET | Freq: Every day | ORAL | Status: DC
  Administered 2016-10-20 – 2016-10-22 (×3): 15 mg via ORAL
  Filled 2016-10-20 (×3): qty 1

## 2016-10-20 MED ORDER — ALPRAZOLAM 0.25 MG PO TABS: 0.2500 mg | ORAL_TABLET | Freq: Two times a day (BID) | ORAL | Status: DC | PRN

## 2016-10-20 MED ORDER — SODIUM CHLORIDE 0.9 % IV SOLN: Freq: Once | INTRAVENOUS | Status: DC

## 2016-10-20 MED ORDER — VITAMIN B-12 1000 MCG PO TABS
1000.0000 ug | ORAL_TABLET | Freq: Every day | ORAL | Status: DC
  Administered 2016-10-20 – 2016-10-22 (×3): 1000 ug via ORAL
  Filled 2016-10-20 (×3): qty 1

## 2016-10-20 MED ORDER — ONDANSETRON HCL 4 MG/2ML IJ SOLN: 4.0000 mg | Freq: Four times a day (QID) | INTRAMUSCULAR | Status: DC | PRN

## 2016-10-20 NOTE — Progress Notes (Signed)
Pt says she needs to use the bathroom. Placed pt on bedpan several times and she is unable to go. Bladder scan shows 454.

## 2016-10-20 NOTE — H&P (Signed)
History and Physical    Carolyn Odonnell LFY:[id number] DOB: Nov 12, 1919 DOA: 10/19/2016  PCP: Kandice Hams, MD   Patient coming from: Nursing home  Chief Complaint: Slurred speech, fall, pallor   HPI: Carolyn Odonnell is a 83 y.o. female with medical history significant for dementia, hypertension, GERD, and transfusion-dependent anemia who presents from her nursing home with slurred speech, pallor, and a fall. Patient had reportedly fallen sometime early this morning at the nursing home with no apparent injury and was helped back into bed. Later in the morning, she is noted to have slurred he should and appeared pale and acutely ill. EMS was contacted for transport to the hospital. There have been no fevers reported at the nursing home and the patient had not been voicing any complaint. She did complain of some right knee pain after the fall and a superficial abrasion was noted, but no other complaints at been made and she had not appeared to be in any respiratory distress. There has not been any vomiting or diarrhea noted. She does have chronic dementia and history is therefore Augmentin through discussion with the patient's son at the bedside. He reports a history of transfusion dependent anemia for which she was last transfused less than a month ago. She has been evaluated by oncology for this, but no further diagnostic investigation was pursued given the patient's age and comorbidity.    ED Course: Upon arrival to the ED, patient is found to be afebrile, saturating 87% on room air, tachypneic in the 30s, blood pressure 97/45, normal heart rate. EKG features a sinus rhythm with left anterior fascicular block and inferior ST depressions. Noncontrast head CT is negative for acute intracranial abnormality and radiographs of the knee are negative for fracture. Chest x-ray is notable for mild pulmonary vascular congestion and borderline cardiomegaly with mild central airspace opacities consistent with  interstitial edema or infection. Chemistry panel features a potassium of 5.8, bicarbonate of 13, BUN 61, creatinine 2.52, up from 1.72 months ago, AST of 225, ALT 123, and bilirubin of 2.4. CBC features a leukocytosis to 17,400 and a macrocytic anemia with hemoglobin of 5.4 and MCV 114.4. Platelets are 61,000. Lactic acid is elevated to 8.33 and troponin is elevated to 0.15. Blood cultures were obtained and the patient was treated with empiric vancomycin and cefepime. She was given 1.5 L of normal saline and 2 units of packed red blood cells were ordered for immediate transfusion. Blood pressure improved some with the IV fluids and patient remained mildly tachypneic. She will be admitted to the stepdown unit for ongoing evaluation and management of symptomatic anemia with acute kidney injury superimposed on chronic kidney disease stage IV, elevated troponin likely secondary to anemia and hypotension, lactic acidosis, possibly reflecting an infectious process versus poor perfusion in the setting of anemia and hypotension.  Review of Systems:  All other systems reviewed and apart from HPI, are negative.  Past Medical History:  Diagnosis Date  . Anxiety   . Arthritis   . Chronic headache   . Constipation   . Depression   . GERD (gastroesophageal reflux disease)   . Hypertension   . Osteoporosis     Past Surgical History:  Procedure Laterality Date  . ABDOMINAL HYSTERECTOMY    . CHOLECYSTECTOMY    . COLON SURGERY       reports that she has never smoked. She has never used smokeless tobacco. She reports that she does not drink alcohol or use drugs.  Allergies  Allergen Reactions  . Zolpidem Other (See Comments)    Causing falls, disorientation, sleep walking events  . Codeine Other (See Comments)    unknown  . Doxycycline Other (See Comments)    unknown  . Fosamax [Alendronate Sodium] Other (See Comments)    unknown  . Morphine And Related Other (See Comments)    unknown  . Novocain  [Procaine Hcl] Other (See Comments)    unknown  . Penicillins Other (See Comments)    unknown  . Percocet [Oxycodone-Acetaminophen] Other (See Comments)    unknown  . Skelaxin [Metaxalone] Other (See Comments)    unknown  . Sulfa Antibiotics Rash    History reviewed. No pertinent family history.   Prior to Admission medications   Medication Sig Start Date End Date Taking? Authorizing Provider  amLODipine-benazepril (LOTREL) 10-40 MG per capsule Take 1 capsule by mouth every morning.    Historical Provider, MD  atenolol (TENORMIN) 25 MG tablet Take 50 mg by mouth every morning.     Historical Provider, MD  b complex vitamins capsule Take 1 capsule by mouth daily. 08/14/16   Brunetta Genera, MD  cholecalciferol (VITAMIN D) 1000 UNITS tablet Take 2,000 Units by mouth every morning.    Historical Provider, MD  Cyanocobalamin (B-12) 1000 MCG SUBL Place 1,000 mcg under the tongue daily. 08/14/16   Brunetta Genera, MD  dexamethasone (DECADRON) 4 MG tablet Take 5 tablets (20 mg total) by mouth once a week. With food (for plasma cell dyscrasia) 08/28/16   Brunetta Genera, MD  ENSURE (ENSURE) Take 237 mLs by mouth 3 (three) times daily between meals.    Historical Provider, MD  ferrous sulfate 325 (65 FE) MG tablet Take 325 mg by mouth daily with breakfast.    Historical Provider, MD  metroNIDAZOLE (METROGEL) 1 % gel Apply 1 application topically daily.    Historical Provider, MD  mirtazapine (REMERON) 15 MG tablet  06/09/16   Historical Provider, MD  omeprazole (PRILOSEC) 20 MG capsule Take 20 mg by mouth every morning.    Historical Provider, MD  PARoxetine (PAXIL) 40 MG tablet Take 40 mg by mouth daily. 02/04/15   Historical Provider, MD  sennosides-docusate sodium (SENOKOT-S) 8.6-50 MG tablet Take 2 tablets by mouth daily.    Historical Provider, MD  traMADol Veatrice Bourbon) 50 MG tablet  06/08/16   Historical Provider, MD    Physical Exam: Vitals:   10/19/16 2301 10/19/16 2315 10/19/16 2330  10/19/16 2345  BP: (!) 92/59 (!) 105/47 (!) 98/49 (!) 122/55  Pulse: 73 71 70 89  Resp: (!) 31 (!) 39 (!) 29 (!) 34  Temp:      TempSrc:      SpO2: 100% 98% 96% 100%  Weight:          Constitutional: Tachypneic, uncomfortable appearing, cachectic.  Eyes: PERTLA, lids and conjunctivae normal ENMT: Mucous membranes are dry. Posterior pharynx clear of any exudate or lesions.   Neck: normal, supple, no masses, no thyromegaly Respiratory: Mild tachypnea. Crackles in bases. No accessory muscle use.  Cardiovascular: S1 & S2 heard, regular rate and rhythm. No extremity edema. JVP 9 cmH20. Abdomen: No distension, no tenderness, no masses palpated. Bowel sounds normal.  Musculoskeletal: no clubbing / cyanosis. No joint deformity upper and lower extremities.   Skin: superficial abrasion to anterior left knee. No other significant rashes, lesions, ulcers. Pale.  Neurologic: No gross facial asymmetry. PERRL, EOMI. Sensation intact.  Psychiatric: Difficult to assess given the clinical scenario with dementia and  acute critical illness.     Labs on Admission: I have personally reviewed following labs and imaging studies  CBC:  Recent Labs Lab 10/19/16 2126  WBC 17.4*  NEUTROABS 7.6  HGB 5.4*  HCT 17.5*  MCV 114.4*  PLT 61*   Basic Metabolic Panel:  Recent Labs Lab 10/19/16 2126  NA 142  K 5.8*  CL 110  CO2 13*  GLUCOSE 121*  BUN 61*  CREATININE 2.52*  CALCIUM 10.3   GFR: Estimated Creatinine Clearance: 9.3 mL/min (A) (by C-G formula based on SCr of 2.52 mg/dL (H)). Liver Function Tests:  Recent Labs Lab 10/19/16 2126  AST 225*  ALT 123*  ALKPHOS 102  BILITOT 2.4*  PROT 6.8  ALBUMIN 3.7   No results for input(s): LIPASE, AMYLASE in the last 168 hours. No results for input(s): AMMONIA in the last 168 hours. Coagulation Profile: No results for input(s): INR, PROTIME in the last 168 hours. Cardiac Enzymes: No results for input(s): CKTOTAL, CKMB, CKMBINDEX,  TROPONINI in the last 168 hours. BNP (last 3 results) No results for input(s): PROBNP in the last 8760 hours. HbA1C: No results for input(s): HGBA1C in the last 72 hours. CBG: No results for input(s): GLUCAP in the last 168 hours. Lipid Profile: No results for input(s): CHOL, HDL, LDLCALC, TRIG, CHOLHDL, LDLDIRECT in the last 72 hours. Thyroid Function Tests: No results for input(s): TSH, T4TOTAL, FREET4, T3FREE, THYROIDAB in the last 72 hours. Anemia Panel: No results for input(s): VITAMINB12, FOLATE, FERRITIN, TIBC, IRON, RETICCTPCT in the last 72 hours. Urine analysis:    Component Value Date/Time   COLORURINE AMBER (A) 12/23/2011 2028   APPEARANCEUR CLOUDY (A) 12/23/2011 2028   LABSPEC 1.014 12/23/2011 2028   PHURINE 5.0 12/23/2011 2028   GLUCOSEU NEGATIVE 12/23/2011 2028   HGBUR NEGATIVE 12/23/2011 2028   BILIRUBINUR SMALL (A) 12/23/2011 2028   KETONESUR NEGATIVE 12/23/2011 2028   PROTEINUR NEGATIVE 12/23/2011 2028   UROBILINOGEN 1.0 12/23/2011 2028   NITRITE NEGATIVE 12/23/2011 2028   LEUKOCYTESUR NEGATIVE 12/23/2011 2028   Sepsis Labs: @LABRCNTIP (procalcitonin:4,lacticidven:4) )No results found for this or any previous visit (from the past 240 hour(s)).   Radiological Exams on Admission: Dg Chest 2 View  Result Date: 10/19/2016 CLINICAL DATA:  Status post fall, with tachypnea and shallow respirations. Decreased O2 saturation. Initial encounter. EXAM: CHEST  2 VIEW COMPARISON:  Chest radiograph performed 12/23/2011 FINDINGS: The lungs are well-aerated. Mild vascular congestion is noted. Mild central airspace opacities could reflect infection or interstitial edema. A small left pleural effusion is noted. There is no evidence of pneumothorax. The heart is borderline enlarged. No acute osseous abnormalities are seen. IMPRESSION: Mild vascular congestion and borderline cardiomegaly. Mild central airspace opacities could reflect infection or interstitial edema. Small left pleural  effusion. Electronically Signed   By: Garald Balding M.D.   On: 10/19/2016 22:16   Ct Head Wo Contrast  Result Date: 10/19/2016 CLINICAL DATA:  Patient fell last night with confusion and slurring of speech this morning EXAM: CT HEAD WITHOUT CONTRAST TECHNIQUE: Contiguous axial images were obtained from the base of the skull through the vertex without intravenous contrast. COMPARISON:  Head CT from 02/04/2015 FINDINGS: BRAIN: There is sulcal and ventricular prominence consistent with superficial and central atrophy. No intraparenchymal hemorrhage, mass effect nor midline shift. Periventricular and subcortical white matter hypodensities consistent with chronic small vessel ischemic disease are identified. No acute large vascular territory infarcts. No abnormal extra-axial fluid collections. Basal cisterns are not effaced and midline. VASCULAR: Moderate calcific  atherosclerosis of the carotid siphons. SKULL: No skull fracture. No significant scalp soft tissue swelling. Degenerate changes of the skullbase and upper cervical spine. SINUSES/ORBITS: The mastoid air-cells are clear. The included paranasal sinuses are well-aerated.The included ocular globes and orbital contents are non-suspicious. Bilateral lens replacements surgeries are noted. OTHER: None. IMPRESSION: Atrophy with chronic small vessel ischemic disease of periventricular white matter. No acute intracranial abnormality is noted. Electronically Signed   By: Ashley Royalty M.D.   On: 10/19/2016 22:36   Dg Knee Complete 4 Views Left  Result Date: 10/19/2016 CLINICAL DATA:  Fall with knee pain EXAM: LEFT KNEE - COMPLETE 4+ VIEW COMPARISON:  None. FINDINGS: The bones are osteopenic. There is no acute fracture or dislocation at the left knee. There is mild chondrocalcinosis within the vertebral joint spaces. Prominent vascular calcifications are noted. No knee effusion. IMPRESSION: No acute fracture or dislocation of the left knee. Medial and lateral  femorotibial compartment chondrocalcinosis. Electronically Signed   By: Ulyses Jarred M.D.   On: 10/19/2016 22:13    EKG: Independently reviewed. Sinus rhythm, LAFB, inferior ST-depression  Assessment/Plan  1. Symptomatic anemia, thrombocytopenia    - Pt is followed by oncology for blood cell dyscrasia - She was noted to have possible paraproteinemia, but her son/POA elected to avoid any further diagnostic intervention  - She has been managed with B-12 and iron supplementation, weekly dexamethasone, and has been requiring transfusions  - Last transfused early February 2018  - Presents with pallor, soft BP, and Hgb of 5.4, down from 8.6 after transfusion in Feb '18  - There is no bleeding evident  - 2 units of pRBCs ordered for immediate transfusion; she will receive Lasix between units as BP allows  - Check post-transfusion CBC   2. Elevated troponin - No anginal complaints, and no apparent hx of CAD  - Initial trop 0.15 and EKG with ST-depressions inferiorly  - Enzyme trend has been flat, consistent with a demand ischemia in setting of symptomatic anemia with soft BP  - Transfuse RBCs as above, trend troponin, monitor on telemetry, repeat EKG   3. Acute kidney injury superimposed on CKD stage IV  - SCr is 2.52 on admission, up from apparent baseline of 1.7  - No obstructive process noted on renal US, which suggests medical renal disease  - Likely prerenal in setting of soft BP  - She was given 1.5 L NS in ED and is continued on IVF given her soft BP, but there is evidence for some pulmonary edema and she will likely need Lasix between units of blood  - Follow fluid status, hold ACE, repeat chem panel in am    4. Slurred speech - Pt noted to have slurred speech on day of presentation by nursing home personel - Head CT is negative for acute intracranial abnormality  - Son at the bedside suspects this is secondary to dry mouth and not wearing her dentures  - If this persists, or focal  deficits are identified, could consider further eval with MRI if felt appropriate    5. Hyperkalemia  - Serum potassium is 5.8 on admission  - She was given 1.5 liter NS in ED and will receive Lasix between units of blood  - She will be monitored on telemetry with serial potassium levels until normalized    6. Hypertension  - BP has been soft and her home medications (amlodipine, benazepril, and atenolol) are held initially  - Monitor and resume treatment as needed  7. GERD - No EGD report on file  - Managed with daily PPI at home, will continue   8. ?Sepsis  - Pt presents with WBC 17,400 and lactate of 8.33  - Urine is not suggestive of infection, abd exam benign, and CXR with suspected pulm edema but may represent infection  - Blood cultures were obtained in ED, she received 30 cc/kg NS, and empiric vancomycin and cefepime were started  - Given her critical illness and uncertain etiology for the impressive leukocytosis and lactate, will continue empiric broad-spectrums pending clinical course and culture data   9. Hyperbilirubinemia, elevated transaminases  - AST and ALT are newly elevated to 225 and 123, respectively, and total bilirubin has increased to 2.4  - No significant abd tenderness and RUQ notable for s/p cholecystectomy  - Likely acute liver injury in setting in soft BP and anemia, will continue to treat underlying processes as above     DVT prophylaxis: SCD's  Code Status: Full; discussed with POA at bedside Family Communication: Son (POA) updated at bedside Disposition Plan: Admit to stepdown Consults called: None Admission status: Inpatient    Vianne Bulls, MD [facility] Pager [phone]  If 7PM-7AM, please contact night-coverage [email or web address] Password [facility]  10/20/2016, 12:09 AM

## 2016-10-20 NOTE — Consult Note (Signed)
Consultation Note Date: 10/20/2016   Patient Name: Carolyn Odonnell  DOB: 04/03/1920  MRN: [id number]  Age / Sex: 83 y.o., female  PCP: Seward Carol, MD Referring Physician: Reyne Dumas, MD  Reason for Consultation: Establishing goals of care, Hospice Evaluation and Psychosocial/spiritual support  HPI/Patient Profile: 83 y.o. female  with past medical history of Hypertension, chronic kidney disease stage IV, non-STEMI, dementia, transfusion dependent anemia admitted on 10/19/2016 with slurred speech, pallor, status post fall. Her hemoglobin on admission was 5.4. Per chart review her last transfusion was February 2018 and at that time her hemoglobin was 8.6. She also has an elevated lactic acid at 8.33, leukocytosis 17,400. She was started on antibiotics empirically. Blood cultures thus far negative..   Clinical Assessment and Goals of Care: Met with patient's son, Lavere Shinsky, to discuss goals of care. Discussed disease trajectory related to dementia as well as her bone marrow dysfunction. Also defined and discuss terms of full code versus DO NOT RESUSCITATE. Patient has been transfusion dependent now for 3 months and this is her third transfusion. Son recognizes that this cannot go on indefinitely and we did talk about risks associated with repeated blood transfusion such as a transfusion reaction as well as the body developing antibodies  Son, Saralyn Pilar Krolikowski, is her surrogate decision maker    SUMMARY OF RECOMMENDATIONS   DNR/DNI Continue to treat the treatable such as transfusions,IV fluids, antibiotics, laboratory and diagnostics  Code Status/Advance Care Planning:  DNR   Palliative Prophylaxis:   Aspiration, Bowel Regimen, Delirium Protocol, Eye Care, Frequent Pain Assessment, Oral Care and Turn Reposition  Additional Recommendations (Limitations, Scope, Preferences):  Initiate Comfort Feeding, No  Artificial Feeding, No Chemotherapy, No Hemodialysis, No Radiation, No Surgical Procedures and No Tracheostomy  Psycho-social/Spiritual:   Desire for further Chaplaincy support:no   Prognosis:   Unable to determine the patient is critically ill, in the setting of transfusion-dependent anemia, bone marrow suppression, leukocytosis, dementia leading to severe debility. Patient is at high risk for an acute event such as heart failure secondary to anemia; cardiopulmonary arrest  Discharge Planning: To Be Determined      Primary Diagnoses: Present on Admission: . CKD (chronic kidney disease), stage IV ([place]) . AKI (acute kidney injury) ([place]) . Slurred speech . Hyperkalemia . NSTEMI (non-ST elevated myocardial infarction) ([place]) . Symptomatic anemia . Anemia . Thrombocytopenia ([place]) . Lactic acidosis . Leucocytosis . HTN (hypertension) . Hyperbilirubinemia . Abnormal transaminases   I have reviewed the medical record, interviewed the patient and family, and examined the patient. The following aspects are pertinent.  Past Medical History:  Diagnosis Date  . Anxiety   . Arthritis   . Blood transfusion without reported diagnosis   . Chronic headache   . Constipation   . Depression   . GERD (gastroesophageal reflux disease)   . Hypertension   . Osteoporosis    Social History   Social History  . Marital status: Widowed    Spouse name: N/A  . Number of children: N/A  . Years  of education: N/A   Social History Main Topics  . Smoking status: Never Smoker  . Smokeless tobacco: Never Used  . Alcohol use No  . Drug use: No  . Sexual activity: Not Asked   Other Topics Concern  . None   Social History Narrative  . None   History reviewed. No pertinent family history. Scheduled Meds: . sodium chloride   Intravenous Once  . ceFEPime (MAXIPIME) IV  500 mg Intravenous Q24H  . cholecalciferol  2,000 Units Oral q morning - 10a  . feeding supplement (ENSURE ENLIVE)  237 mL  Oral TID BM  . mirtazapine  15 mg Oral QHS  . pantoprazole (PROTONIX) IV  40 mg Intravenous Q12H  . PARoxetine  40 mg Oral Daily  . senna-docusate  2 tablet Oral Daily  . [START ON 10/21/2016] vancomycin  500 mg Intravenous Q48H  . vitamin B-12  1,000 mcg Oral Daily   Continuous Infusions: PRN Meds:.ALPRAZolam, nitroGLYCERIN, ondansetron (ZOFRAN) IV Medications Prior to Admission:  Prior to Admission medications   Medication Sig Start Date End Date Taking? Authorizing Provider  acetaminophen (TYLENOL) 500 MG tablet Take 500 mg by mouth every 6 (six) hours as needed for moderate pain.   Yes Historical Provider, MD  amLODipine (NORVASC) 5 MG tablet Take 5 mg by mouth daily.   Yes Historical Provider, MD  atenolol (TENORMIN) 25 MG tablet Take 25 mg by mouth every morning.    Yes Historical Provider, MD  b complex vitamins capsule Take 1 capsule by mouth daily. 08/14/16  Yes Brunetta Genera, MD  cholecalciferol (VITAMIN D) 1000 UNITS tablet Take 2,000 Units by mouth every morning.   Yes Historical Provider, MD  Cyanocobalamin (B-12) 1000 MCG SUBL Place 1,000 mcg under the tongue daily. 08/14/16  Yes Brunetta Genera, MD  dexamethasone (DECADRON) 4 MG tablet Take 5 tablets (20 mg total) by mouth once a week. With food (for plasma cell dyscrasia) Patient taking differently: Take 20 mg by mouth every Wednesday. With food (for plasma cell dyscrasia) 08/28/16  Yes Brunetta Genera, MD  ENSURE (ENSURE) Take 237 mLs by mouth 3 (three) times daily with meals.    Yes Historical Provider, MD  ferrous sulfate 325 (65 FE) MG tablet Take 325 mg by mouth 2 (two) times daily.    Yes Historical Provider, MD  metroNIDAZOLE (METROGEL) 1 % gel Apply 1 application topically daily.   Yes Historical Provider, MD  mirtazapine (REMERON) 15 MG tablet Take 15 mg by mouth at bedtime.  06/09/16  Yes Historical Provider, MD  omeprazole (PRILOSEC) 20 MG capsule Take 20 mg by mouth every morning.   Yes Historical Provider,  MD  PARoxetine (PAXIL) 40 MG tablet Take 40 mg by mouth daily. 02/04/15  Yes Historical Provider, MD  sennosides-docusate sodium (SENOKOT-S) 8.6-50 MG tablet Take 2 tablets by mouth daily.   Yes Historical Provider, MD  Skin Protectants, Misc. (EUCERIN) cream Apply 1 application topically 2 (two) times daily as needed for dry skin (to feet).   Yes Historical Provider, MD  traMADol (ULTRAM) 50 MG tablet Take 50 mg by mouth 2 (two) times daily.  06/08/16  Yes Historical Provider, MD   Allergies  Allergen Reactions  . Zolpidem Other (See Comments)    Causing falls, disorientation, sleep walking events  . Codeine Other (See Comments)    unknown  . Doxycycline Other (See Comments)    unknown  . Fosamax [Alendronate Sodium] Other (See Comments)    unknown  . Morphine  And Related Other (See Comments)    unknown  . Novocain [Procaine Hcl] Other (See Comments)    unknown  . Penicillins Other (See Comments)    unknown  . Percocet [Oxycodone-Acetaminophen] Other (See Comments)    unknown  . Skelaxin [Metaxalone] Other (See Comments)    unknown  . Sulfa Antibiotics Rash   Review of Systems  Unable to perform ROS: Other    Physical Exam  Constitutional:  Cachectic, frail, pale elderly female who appears acutely ill  HENT:  Head: Atraumatic.  Temporal wasting  Cardiovascular:  PVCs  Pulmonary/Chest: Effort normal.  Musculoskeletal: Normal range of motion.  Neurological:    Minimal speech and what a few words she tries to communicate are hard to understand. Is keeping her eyes closed  Skin:  Cool, pale  Psychiatric:  Appears anxious and frightened when I attempt to speak to her. She begins to shake him pull the covers over herself  Nursing note and vitals reviewed.   Vital Signs: BP 122/86   Pulse 77   Temp 98.4 F (36.9 C) (Axillary)   Resp (!) 27   Wt 46.6 kg (102 lb 11.2 oz)   SpO2 (!) 89%   BMI 18.54 kg/m  Pain Assessment: No/denies pain       SpO2: SpO2: (!) 89  % O2 Device:SpO2: (!) 89 % O2 Flow Rate: .O2 Flow Rate (L/min): 6 L/min  IO: Intake/output summary:  Intake/Output Summary (Last 24 hours) at 10/20/16 1402 Last data filed at 10/20/16 0556  Gross per 24 hour  Intake          1356.08 ml  Output              250 ml  Net          1106.08 ml    LBM: Last BM Date: 10/19/16 Baseline Weight: Weight: 43.9 kg (96 lb 12.5 oz) Most recent weight: Weight: 46.6 kg (102 lb 11.2 oz)     Palliative Assessment/Data:   Flowsheet Rows     Most Recent Value  Intake Tab  Referral Department  Hospitalist  Unit at Time of Referral  Med/Surg Unit  Palliative Care Primary Diagnosis  Sepsis/Infectious Disease  Date Notified  10/20/16  Palliative Care Type  New Palliative care  Reason for referral  Clarify Goals of Care  Date of Admission  10/19/16  Date first seen by Palliative Care  10/20/16  # of days Palliative referral response time  0 Day(s)  # of days IP prior to Palliative referral  1  Clinical Assessment  Palliative Performance Scale Score  30%  Pain Max last 24 hours  Not able to report  Pain Min Last 24 hours  Not able to report  Dyspnea Max Last 24 Hours  Not able to report  Dyspnea Min Last 24 hours  Not able to report  Nausea Max Last 24 Hours  Not able to report  Nausea Min Last 24 Hours  Not able to report  Anxiety Max Last 24 Hours  Not able to report  Anxiety Min Last 24 Hours  Not able to report  Other Max Last 24 Hours  Not able to report  Psychosocial & Spiritual Assessment  Palliative Care Outcomes  Patient/Family meeting held?  Yes  Who was at the meeting?  son  Palliative Care Outcomes  Clarified goals of care      Time In: 1530 Time Out: 1700 Time Total: 90 min Greater than 50%  of this time was  spent counseling and coordinating care related to the above assessment and plan.  Signed by: Dory Horn, NP   Please contact Palliative Medicine Team phone at [phone] for questions and concerns.  For  individual provider: See Shea Evans

## 2016-10-20 NOTE — Progress Notes (Signed)
This RN was asked to assess pt status due to abnormal findings from primary RN. Pt lungs clear to ascultation bilat, mentation no different than baseline per primary rn. B/P was elevated from pre-transfusion documentation ( 112/52--> 141/61). Transfusion stopped (0556) per protocol and NS started. MD night triad made aware and told this RN to call blood bank and follow protocol for a blood transfusion reaction. No ordered entered by provider.  Blood bank called and paper work was completed by this RN and delivered back to blood bank at 0700. Blood bank to enter labs to be drawn ( which were collected at time of this note, 0728). Primary RN followed transfusion reaction protocol to ensure safety of the patient.

## 2016-10-20 NOTE — Progress Notes (Signed)
Lab called to advise of critical lab Lactic Acid which is 3.5. Previous lab was 5.7

## 2016-10-20 NOTE — Progress Notes (Signed)
Patient with no documented urine output since 23:56 last night. Bladder scan measured a volume of 508 cc. MD notified, foley catheter ordered. When RN went to insert foley patients bed wet with urine. Bladder scan obtained with a volume of 120 cc. Foley order discontinued. Will continue to monitor patient, Wardell Honour, RN

## 2016-10-20 NOTE — Progress Notes (Signed)
[facility] PROGRESS NOTE  Carolyn Odonnell WER:[id number] DOB: March 11, 1920 DOA: 10/19/2016   PCP: Kandice Hams, MD     Assessment/Plan: Principal Problem:   Symptomatic anemia Active Problems:   Leucocytosis   HTN (hypertension)   CKD (chronic kidney disease), stage IV (HCC)   AKI (acute kidney injury) ([place])   Slurred speech   Hyperkalemia   NSTEMI (non-ST elevated myocardial infarction) (HCC)   Anemia   Thrombocytopenia (HCC)   Lactic acidosis   Hyperbilirubinemia   Abnormal transaminases   Elevated liver function tests   Elevated troponin    83 y.o. female with medical history significant for dementia, hypertension, GERD, and transfusion-dependent anemia who presents from her nursing home with slurred speech, pallor, and a fall. She was admitted to the stepdown unit for ongoing evaluation and management of symptomatic anemia with acute kidney injury superimposed on chronic kidney disease stage IV, elevated troponin likely secondary to anemia and hypotension, lactic acidosis, possibly reflecting an infectious process versus poor perfusion in the setting of anemia and hypotension.  Assessment and plan 1. Symptomatic anemia, thrombocytopenia    - Pt is followed by oncology for blood cell dyscrasia - She was noted to have possible paraproteinemia, but her son/POA elected to avoid any further diagnostic intervention  - She has been managed with B-12 and iron supplementation, weekly dexamethasone, and has been requiring transfusions  - Last transfused early February 2018  - Presents with pallor, soft BP, and Hgb of 5.4, down from 8.6 after transfusion in Feb '18  - There is no bleeding evident  - 2 units of pRBCs ordered for immediate transfusion; she will receive Lasix between units as BP allows  - Check post-transfusion CBC   2. Elevated troponin - No anginal complaints, and no apparent hx of CAD  - Initial trop 0.15 and EKG with ST-depressions inferiorly  -  Enzyme trend has been flat, consistent with a demand ischemia in setting of symptomatic anemia with soft BP  Keep hemoglobin greater than 8.0  3. Acute kidney injury superimposed on CKD stage IV  - SCr is 2.52 on admission, up from apparent baseline of 1.7 , now 2.33 - No obstructive process noted on renal US, which suggests medical renal disease  - Likely prerenal in setting of soft BP  - She was given 1.5 L NS in ED and is continued on IVF given her soft BP, but there is evidence for some pulmonary edema and she will likely need Lasix between units of blood  - Follow fluid status, hold ACE, repeat chem panel in am   kayexalate for hyperkalemia  4. Slurred speech - Pt noted to have slurred speech on day of presentation by nursing home personel - Head CT is negative for acute intracranial abnormality  - Son at the bedside suspects this is secondary to dry mouth and not wearing her dentures  - If this persists, or focal deficits are identified, could consider further eval with MRI if felt appropriate    5. Hyperkalemia  - Serum potassium is 5.8 on admission  Potassium down to 5.2 after she received Lasix - She will be monitored on telemetry with serial potassium levels until normalized    6. Hypertension  - BP has been soft and her home medications (amlodipine, benazepril, and atenolol) are held initially  - Monitor and resume treatment as needed   7. GERD - No EGD report on file  - Managed with daily PPI at home,  will continue   8. ?Sepsis  - Pt presents with WBC 17,400 and lactate of 8.33  - Urine is not suggestive of infection, abd exam benign, and CXR with suspected pulm edema but may represent infection  - Blood cultures no growth so far, she received 30 cc/kg NS, and empiric vancomycin and cefepime were started, day #2 - Given her critical illness and uncertain etiology for the impressive leukocytosis and lactate, will continue empiric broad-spectrums antibiotics pending  culture  9. Hyperbilirubinemia, elevated transaminases  - AST and ALT are newly elevated to 225 and 123,repeat labs shows they are improving ,bilirubin 2.4>3.3 - No significant abd tenderness and RUQ notable for s/p cholecystectomy  - Likely acute liver injury in setting in soft BP and anemia, check CA19-9,CEA Son has expressed desire to be conservative in the past ,  USG shows she is s/p cholecystectomy     DVT prophylaxsis SCDs  Code Status:  Full code    Family Communication: Discussed in detail with the patient, all imaging results, lab results explained to the patient   Disposition Plan: SNF Monday   Consultants:  None  Procedures:  None  Antibiotics: Anti-infectives    Start     Dose/Rate Route Frequency Ordered Stop   10/21/16 2300  vancomycin (VANCOCIN) 500 mg in sodium chloride 0.9 % 100 mL IVPB     500 mg 100 mL/hr over 60 Minutes Intravenous Every 48 hours 10/19/16 2219     10/20/16 2300  ceFEPIme (MAXIPIME) 500 mg in dextrose 5 % 50 mL IVPB     500 mg 100 mL/hr over 30 Minutes Intravenous Every 24 hours 10/19/16 2219     10/19/16 2230  ceFEPIme (MAXIPIME) 1 g in dextrose 5 % 50 mL IVPB     1 g 100 mL/hr over 30 Minutes Intravenous  Once 10/19/16 2219 10/20/16 0014   10/19/16 2200  vancomycin (VANCOCIN) IVPB 750 mg/150 ml premix     750 mg 150 mL/hr over 60 Minutes Intravenous  Once 10/19/16 2153 10/19/16 2324   10/19/16 2200  ceFEPIme (MAXIPIME) 2 g in dextrose 5 % 50 mL IVPB  Status:  Discontinued     2 g 100 mL/hr over 30 Minutes Intravenous  Once 10/19/16 2153 10/20/16 0154         HPI/Subjective: Confused , muffles speech  Objective: Vitals:   10/20/16 0343 10/20/16 0500 10/20/16 0556 10/20/16 0827  BP: (!) 126/57  (!) 141/61 138/62  Pulse: 75 77 76 77  Resp: (!) 38 (!) 23 (!) 30 (!) 32  Temp: 97.7 F (36.5 C)  98.8 F (37.1 C) 98.6 F (37 C)  TempSrc: Oral  Axillary Axillary  SpO2: 98% 91% 93% (!) 89%  Weight:  46.6 kg (102 lb  11.2 oz)      Intake/Output Summary (Last 24 hours) at 10/20/16 0076 Last data filed at 10/20/16 0556  Gross per 24 hour  Intake          1356.08 ml  Output              250 ml  Net          1106.08 ml    Exam:  Examination:  General exam: frail and confused  Respiratory system: Clear to auscultation. Respiratory effort normal. Cardiovascular system: S1 & S2 heard, RRR. No JVD, murmurs, rubs, gallops or clicks. No pedal edema. Gastrointestinal system: Abdomen is nondistended, soft and nontender. No organomegaly or masses felt. Normal bowel sounds heard. Central nervous system:  .  No focal neurological deficits. Extremities: Symmetric 5 x 5 power. Skin: No rashes, lesions or ulcers Psychiatry: Judgement  imapired     Data Reviewed: I have personally reviewed following labs and imaging studies  Micro Results Recent Results (from the past 240 hour(s))  MRSA PCR Screening     Status: None   Collection Time: 10/20/16 12:56 AM  Result Value Ref Range Status   MRSA by PCR NEGATIVE NEGATIVE Final    Comment:        The GeneXpert MRSA Assay (FDA approved for NASAL specimens only), is one component of a comprehensive MRSA colonization surveillance program. It is not intended to diagnose MRSA infection nor to guide or monitor treatment for MRSA infections.     Radiology Reports Dg Chest 2 View  Result Date: 10/19/2016 CLINICAL DATA:  Status post fall, with tachypnea and shallow respirations. Decreased O2 saturation. Initial encounter. EXAM: CHEST  2 VIEW COMPARISON:  Chest radiograph performed 12/23/2011 FINDINGS: The lungs are well-aerated. Mild vascular congestion is noted. Mild central airspace opacities could reflect infection or interstitial edema. A small left pleural effusion is noted. There is no evidence of pneumothorax. The heart is borderline enlarged. No acute osseous abnormalities are seen. IMPRESSION: Mild vascular congestion and borderline cardiomegaly. Mild  central airspace opacities could reflect infection or interstitial edema. Small left pleural effusion. Electronically Signed   By: Garald Balding M.D.   On: 10/19/2016 22:16   Ct Head Wo Contrast  Result Date: 10/19/2016 CLINICAL DATA:  Patient fell last night with confusion and slurring of speech this morning EXAM: CT HEAD WITHOUT CONTRAST TECHNIQUE: Contiguous axial images were obtained from the base of the skull through the vertex without intravenous contrast. COMPARISON:  Head CT from 02/04/2015 FINDINGS: BRAIN: There is sulcal and ventricular prominence consistent with superficial and central atrophy. No intraparenchymal hemorrhage, mass effect nor midline shift. Periventricular and subcortical white matter hypodensities consistent with chronic small vessel ischemic disease are identified. No acute large vascular territory infarcts. No abnormal extra-axial fluid collections. Basal cisterns are not effaced and midline. VASCULAR: Moderate calcific atherosclerosis of the carotid siphons. SKULL: No skull fracture. No significant scalp soft tissue swelling. Degenerate changes of the skullbase and upper cervical spine. SINUSES/ORBITS: The mastoid air-cells are clear. The included paranasal sinuses are well-aerated.The included ocular globes and orbital contents are non-suspicious. Bilateral lens replacements surgeries are noted. OTHER: None. IMPRESSION: Atrophy with chronic small vessel ischemic disease of periventricular white matter. No acute intracranial abnormality is noted. Electronically Signed   By: Ashley Royalty M.D.   On: 10/19/2016 22:36   US Abdomen Complete  Result Date: 10/20/2016 CLINICAL DATA:  Unspecified disorder of liver function with elevated liver function tests, acute renal injury. History of cholecystectomy, hysterectomy and colon surgery. EXAM: ABDOMEN ULTRASOUND COMPLETE COMPARISON:  CT from 12/24/2011 FINDINGS: Gallbladder: Surgically absent Common bile duct: Diameter: 9 mm Liver: No  space-occupying mass or biliary dilatation. Small amount of free fluid is noted surrounding liver. IVC: No abnormality visualized. Pancreas: Visualized portion unremarkable. Spleen: The spleen is mildly enlarged and measures 11.4 x 9.2 x 8.6 cm for a volume of 470 cubic cm. Right Kidney: Length: 9.1 cm. Echogenicity within increased. There is an echogenic noncalcified upper pole lesion consistent with a small angiomyolipoma. This measures approximately 1.3 x 0.9 x 1.2 cm. No nephrolithiasis nor obstructive uropathy. Left Kidney: Length: 9.2 cm with increased echogenicity. No mass or hydronephrosis visualized. Abdominal aorta: No aneurysm visualized.  Aortic atherosclerosis. Other findings: Left greater than right  partially visualized small pleural effusions. IMPRESSION: 1. Status post cholecystectomy. 2. 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 Increased bilateral renal echogenicity consistent with medical renal disease. No obstructive uropathy. 3. Small amount of ascites adjacent to the liver. 4. Partially visualized pleural effusions bilaterally left greater than right. 5. Aortic atherosclerosis. Electronically Signed   By: Ashley Royalty M.D.   On: 10/20/2016 03:32   Dg Knee Complete 4 Views Left  Result Date: 10/19/2016 CLINICAL DATA:  Fall with knee pain EXAM: LEFT KNEE - COMPLETE 4+ VIEW COMPARISON:  None. FINDINGS: The bones are osteopenic. There is no acute fracture or dislocation at the left knee. There is mild chondrocalcinosis within the vertebral joint spaces. Prominent vascular calcifications are noted. No knee effusion. IMPRESSION: No acute fracture or dislocation of the left knee. Medial and lateral femorotibial compartment chondrocalcinosis. Electronically Signed   By: Ulyses Jarred M.D.   On: 10/19/2016 22:13     CBC  Recent Labs Lab 10/19/16 2126  WBC 17.4*  HGB 5.4*  HCT 17.5*  PLT 61*  MCV 114.4*  MCH 35.3*  MCHC 30.9  RDW 22.7*  LYMPHSABS 7.5*   MONOABS 2.3*  EOSABS 0.0  BASOSABS 0.0    Chemistries   Recent Labs Lab 10/19/16 2126 10/20/16 0107 10/20/16 0315 10/20/16 0711  NA 142  --   --   --   K 5.8* 5.7* 5.4* 5.2*  CL 110  --   --   --   CO2 13*  --   --   --   GLUCOSE 121*  --   --   --   BUN 61*  --   --   --   CREATININE 2.52*  --   --   --   CALCIUM 10.3  --   --   --   AST 225*  --   --   --   ALT 123*  --   --   --   ALKPHOS 102  --   --   --   BILITOT 2.4*  --   --   --    ------------------------------------------------------------------------------------------------------------------ estimated creatinine clearance is 9.8 mL/min (A) (by C-G formula based on SCr of 2.52 mg/dL (H)). ------------------------------------------------------------------------------------------------------------------ No results for input(s): HGBA1C in the last 72 hours. ------------------------------------------------------------------------------------------------------------------ No results for input(s): CHOL, HDL, LDLCALC, TRIG, CHOLHDL, LDLDIRECT in the last 72 hours. ------------------------------------------------------------------------------------------------------------------ No results for input(s): TSH, T4TOTAL, T3FREE, THYROIDAB in the last 72 hours.  Invalid input(s): FREET3 ------------------------------------------------------------------------------------------------------------------ No results for input(s): VITAMINB12, FOLATE, FERRITIN, TIBC, IRON, RETICCTPCT in the last 72 hours.  Coagulation profile No results for input(s): INR, PROTIME in the last 168 hours.  No results for input(s): DDIMER in the last 72 hours.  Cardiac Enzymes  Recent Labs Lab 10/19/16 2330 10/20/16 0107 10/20/16 0711  TROPONINI 0.09* 0.10* 0.10*   ------------------------------------------------------------------------------------------------------------------ Invalid input(s): POCBNP   CBG: No results for input(s):  GLUCAP in the last 168 hours.     Studies: Dg Chest 2 View  Result Date: 10/19/2016 CLINICAL DATA:  Status post fall, with tachypnea and shallow respirations. Decreased O2 saturation. Initial encounter. EXAM: CHEST  2 VIEW COMPARISON:  Chest radiograph performed 12/23/2011 FINDINGS: The lungs are well-aerated. Mild vascular congestion is noted. Mild central airspace opacities could reflect infection or interstitial edema. A small left pleural effusion is noted. There is no evidence of pneumothorax. The heart is borderline enlarged. No acute osseous abnormalities are seen. IMPRESSION: Mild vascular congestion  and borderline cardiomegaly. Mild central airspace opacities could reflect infection or interstitial edema. Small left pleural effusion. Electronically Signed   By: Garald Balding M.D.   On: 10/19/2016 22:16   Ct Head Wo Contrast  Result Date: 10/19/2016 CLINICAL DATA:  Patient fell last night with confusion and slurring of speech this morning EXAM: CT HEAD WITHOUT CONTRAST TECHNIQUE: Contiguous axial images were obtained from the base of the skull through the vertex without intravenous contrast. COMPARISON:  Head CT from 02/04/2015 FINDINGS: BRAIN: There is sulcal and ventricular prominence consistent with superficial and central atrophy. No intraparenchymal hemorrhage, mass effect nor midline shift. Periventricular and subcortical white matter hypodensities consistent with chronic small vessel ischemic disease are identified. No acute large vascular territory infarcts. No abnormal extra-axial fluid collections. Basal cisterns are not effaced and midline. VASCULAR: Moderate calcific atherosclerosis of the carotid siphons. SKULL: No skull fracture. No significant scalp soft tissue swelling. Degenerate changes of the skullbase and upper cervical spine. SINUSES/ORBITS: The mastoid air-cells are clear. The included paranasal sinuses are well-aerated.The included ocular globes and orbital contents are  non-suspicious. Bilateral lens replacements surgeries are noted. OTHER: None. IMPRESSION: Atrophy with chronic small vessel ischemic disease of periventricular white matter. No acute intracranial abnormality is noted. Electronically Signed   By: Ashley Royalty M.D.   On: 10/19/2016 22:36   US Abdomen Complete  Result Date: 10/20/2016 CLINICAL DATA:  Unspecified disorder of liver function with elevated liver function tests, acute renal injury. History of cholecystectomy, hysterectomy and colon surgery. EXAM: ABDOMEN ULTRASOUND COMPLETE COMPARISON:  CT from 12/24/2011 FINDINGS: Gallbladder: Surgically absent Common bile duct: Diameter: 9 mm Liver: No space-occupying mass or biliary dilatation. Small amount of free fluid is noted surrounding liver. IVC: No abnormality visualized. Pancreas: Visualized portion unremarkable. Spleen: The spleen is mildly enlarged and measures 11.4 x 9.2 x 8.6 cm for a volume of 470 cubic cm. Right Kidney: Length: 9.1 cm. Echogenicity within increased. There is an echogenic noncalcified upper pole lesion consistent with a small angiomyolipoma. This measures approximately 1.3 x 0.9 x 1.2 cm. No nephrolithiasis nor obstructive uropathy. Left Kidney: Length: 9.2 cm with increased echogenicity. No mass or hydronephrosis visualized. Abdominal aorta: No aneurysm visualized.  Aortic atherosclerosis. Other findings: Left greater than right partially visualized small pleural effusions. IMPRESSION: 1. Status post cholecystectomy. 2. 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 Increased bilateral renal echogenicity consistent with medical renal disease. No obstructive uropathy. 3. Small amount of ascites adjacent to the liver. 4. Partially visualized pleural effusions bilaterally left greater than right. 5. Aortic atherosclerosis. Electronically Signed   By: Ashley Royalty M.D.   On: 10/20/2016 03:32   Dg Knee Complete 4 Views Left  Result Date: 10/19/2016 CLINICAL DATA:   Fall with knee pain EXAM: LEFT KNEE - COMPLETE 4+ VIEW COMPARISON:  None. FINDINGS: The bones are osteopenic. There is no acute fracture or dislocation at the left knee. There is mild chondrocalcinosis within the vertebral joint spaces. Prominent vascular calcifications are noted. No knee effusion. IMPRESSION: No acute fracture or dislocation of the left knee. Medial and lateral femorotibial compartment chondrocalcinosis. Electronically Signed   By: Ulyses Jarred M.D.   On: 10/19/2016 22:13      No results found for: HGBA1C Lab Results  Component Value Date   CREATININE 2.52 (H) 10/19/2016       Scheduled Meds: . sodium chloride   Intravenous Once  . ceFEPime (MAXIPIME) IV  500 mg Intravenous Q24H  .  cholecalciferol  2,000 Units Oral q morning - 10a  . feeding supplement (ENSURE ENLIVE)  237 mL Oral TID BM  . mirtazapine  15 mg Oral QHS  . pantoprazole (PROTONIX) IV  40 mg Intravenous Q12H  . PARoxetine  40 mg Oral Daily  . senna-docusate  2 tablet Oral Daily  . [START ON 10/21/2016] vancomycin  500 mg Intravenous Q48H  . vitamin B-12  1,000 mcg Oral Daily   Continuous Infusions:   LOS: 1 day    Time spent: >30 MINS    Reyne Dumas  [facility] Pager [phone]. If 7PM-7AM, please contact night-coverage at [email or web address], password [facility] 10/20/2016, 9:52 AM  LOS: 1 day

## 2016-10-20 NOTE — Progress Notes (Signed)
SLP Cancellation Note  Patient Details Name: Carolyn Odonnell MRN: [id number] DOB: [birth year]   Cancelled treatment:       Reason Eval/Treat Not Completed: Medical issues which prohibited therapy. Attempted to see pt; NP present, calling RN due to pt distress. Will defer swallow evaluation at this time. Will f/u tomorrow if appropriate.  Deneise Lever, [place] CF-SLP Speech-Language Pathologist [phone]   Aliene Altes 10/20/2016, 3:44 PM

## 2016-10-21 LAB — TYPE AND SCREEN
ABO/RH(D): AB POS
Antibody Screen: POSITIVE
DAT, IgG: POSITIVE
DONOR AG TYPE: NEGATIVE
DONOR AG TYPE: NEGATIVE
UNIT DIVISION: 0
Unit division: 0

## 2016-10-21 LAB — BPAM RBC
BLOOD PRODUCT EXPIRATION DATE: 201804212359
Blood Product Expiration Date: 201804202359
ISSUE DATE / TIME: 201804140311
ISSUE DATE / TIME: 201804141338
UNIT TYPE AND RH: 5100
Unit Type and Rh: 5100

## 2016-10-21 MED ORDER — RESOURCE THICKENUP CLEAR PO POWD
ORAL | Status: DC | PRN
  Filled 2016-10-21: qty 125

## 2016-10-21 MED ORDER — ACETAMINOPHEN 325 MG PO TABS
650.0000 mg | ORAL_TABLET | Freq: Four times a day (QID) | ORAL | Status: AC | PRN
  Administered 2016-10-21 – 2016-10-22 (×2): 650 mg via ORAL
  Filled 2016-10-21 (×2): qty 2

## 2016-10-21 NOTE — Progress Notes (Signed)
Daily Progress Note   Patient Name: Carolyn Odonnell       Date: 10/21/2016 DOB: [birth year]  Age: 83 y.o. MRN#: [id number] Attending Physician: Jani Gravel, MD Primary Care Physician: Kandice Hams, MD Admit Date: 10/19/2016  Reason for Consultation/Follow-up: Establishing goals of care and Psychosocial/spiritual support  Subjective: Pt improved today. She is more alert. Speech still very hard to understand. Son states it has been like this for a couple of weeks. She is speaking very rapidly and when you ask her to slow down it is easier to understand. She is mostly praying and singing hymns. Started on dysphagia 1 diet and consuming water and bites of pudding, mash potatoes. Son at the bedside  Length of Stay: 2  Current Medications: Scheduled Meds:  . sodium chloride   Intravenous Once  . ceFEPime (MAXIPIME) IV  500 mg Intravenous Q24H  . cholecalciferol  2,000 Units Oral q morning - 10a  . feeding supplement (ENSURE ENLIVE)  237 mL Oral TID BM  . mirtazapine  15 mg Oral QHS  . pantoprazole (PROTONIX) IV  40 mg Intravenous Q12H  . PARoxetine  40 mg Oral Daily  . senna-docusate  2 tablet Oral Daily  . vancomycin  500 mg Intravenous Q48H  . vitamin B-12  1,000 mcg Oral Daily    Continuous Infusions:   PRN Meds: acetaminophen, ALPRAZolam, nitroGLYCERIN, ondansetron (ZOFRAN) IV, RESOURCE THICKENUP CLEAR  Physical Exam  Constitutional:  Frail, cachetic elderly lady  HENT:  Temporal wasting  Cardiovascular:  PVC's  Pulmonary/Chest: Effort normal.  Musculoskeletal: Normal range of motion.  Neurological: She is alert.  Speech still very difficult to understand but she is verbalizing more  Skin: Skin is warm and dry. There is pallor.  Psychiatric:  No agitation  Nursing note  reviewed.           Vital Signs: BP (!) 146/64 (BP Location: Left Arm)   Pulse 88   Temp 99.2 F (37.3 C) (Axillary)   Resp (!) 27   Wt 43.2 kg (95 lb 4.8 oz)   SpO2 95%   BMI 17.21 kg/m  SpO2: SpO2: 95 % O2 Device: O2 Device: Nasal Cannula O2 Flow Rate: O2 Flow Rate (L/min): 2.5 L/min  Intake/output summary:  Intake/Output Summary (Last 24 hours) at 10/21/16 1539 Last data filed at 10/21/16 0600  Gross per 24 hour  Intake              410 ml  Output                0 ml  Net              410 ml   LBM: Last BM Date: 10/20/16 Baseline Weight: Weight: 43.9 kg (96 lb 12.5 oz) Most recent weight: Weight: 43.2 kg (95 lb 4.8 oz)       Palliative Assessment/Data:    Flowsheet Rows     Most Recent Value  Intake Tab  Referral Department  Hospitalist  Unit at Time of Referral  Med/Surg Unit  Palliative Care Primary Diagnosis  Sepsis/Infectious Disease  Date Notified  10/20/16  Palliative Care Type  New Palliative care  Reason for referral  Clarify Goals of Care  Date of Admission  10/19/16  Date first seen by Palliative Care  10/20/16  # of days Palliative referral response time  0 Day(s)  # of days IP prior to Palliative referral  1  Clinical Assessment  Palliative Performance Scale Score  30%  Pain Max last 24 hours  Not able to report  Pain Min Last 24 hours  Not able to report  Dyspnea Max Last 24 Hours  Not able to report  Dyspnea Min Last 24 hours  Not able to report  Nausea Max Last 24 Hours  Not able to report  Nausea Min Last 24 Hours  Not able to report  Anxiety Max Last 24 Hours  Not able to report  Anxiety Min Last 24 Hours  Not able to report  Other Max Last 24 Hours  Not able to report  Psychosocial & Spiritual Assessment  Palliative Care Outcomes  Patient/Family meeting held?  Yes  Who was at the meeting?  son  Palliative Care Outcomes  Clarified goals of care      Patient Active Problem List   Diagnosis Date Noted  . Hyperbilirubinemia  10/20/2016  . Abnormal transaminases 10/20/2016  . Elevated liver function tests   . Elevated troponin   . Palliative care by specialist   . CKD (chronic kidney disease), stage IV ([place]) 10/19/2016  . AKI (acute kidney injury) ([place]) 10/19/2016  . Slurred speech 10/19/2016  . Hyperkalemia 10/19/2016  . NSTEMI (non-ST elevated myocardial infarction) ([place]) 10/19/2016  . Symptomatic anemia 10/19/2016  . Anemia 10/19/2016  . Thrombocytopenia ([place]) 10/19/2016  . Lactic acidosis 10/19/2016  . Syncope 12/23/2011  . Leucocytosis 12/23/2011  . Thoracic spine fracture ([place]) 12/23/2011  . HTN (hypertension) 12/23/2011    Palliative Care Assessment & Plan   Patient Profile: 83 y.o. female  with past medical history of Hypertension, chronic kidney disease stage IV, non-STEMI, dementia, transfusion dependent anemia admitted on 10/19/2016 with slurred speech, pallor, status post fall. Her hemoglobin on admission was 5.4. Per chart review her last transfusion was February 2018 and at that time her hemoglobin was 8.6. She also has an elevated lactic acid at 8.33, leukocytosis 17,400. She was started on antibiotics empirically. Blood cultures thus far negative. Son is questioning if poor dentition could be the source of her infection   Recommendations/Plan:  Plan at this point to continue to treat the treatable and return to facility  PMT to stay involved. Will monitor for decline and be available to assist with hospice transition if need arises. Pt likely meets her hospice benefit now but son not in a position to discuss this  now   Code Status:    Code Status Orders        Start     Ordered   10/20/16 1716  Do not attempt resuscitation (DNR)  Continuous    Question Answer Comment  In the event of cardiac or respiratory ARREST Do not call a "code blue"   In the event of cardiac or respiratory ARREST Do not perform Intubation, CPR, defibrillation or ACLS   In the event of cardiac or  respiratory ARREST Use medication by any route, position, wound care, and other measures to relive pain and suffering. May use oxygen, suction and manual treatment of airway obstruction as needed for comfort.      10/20/16 1715    Code Status History    Date Active Date Inactive Code Status Order ID Comments User Context   10/20/2016 12:08 AM 10/20/2016  5:15 PM Full Code [id number]  Vianne Bulls, MD ED   12/23/2011 11:44 PM 12/27/2011  4:46 PM Full Code [id number]  Roxan Diesel, RN Inpatient    Advance Directive Documentation     Most Recent Value  Type of Advance Directive  Healthcare Power of Attorney  Pre-existing out of facility DNR order (yellow form or pink MOST form)  -  "MOST" Form in Place?  -       Prognosis:   < 6 months in the setting of transfusion-dependent anemia, bone marrow suppression, leukocytosis, dementia leading to severe debility. Patient is at high risk for an acute event such as heart failure secondary to anemia; cardiopulmonary arrest    Discharge Planning:  To Be Determined    Thank you for allowing the Palliative Medicine Team to assist in the care of this patient.   Time In: 1500 Time Out: 1530 Total Time 30 min Prolonged Time Billed  no       Greater than 50%  of this time was spent counseling and coordinating care related to the above assessment and plan.  Dory Horn, NP  Please contact Palliative Medicine Team phone at [phone] for questions and concerns.

## 2016-10-21 NOTE — Progress Notes (Signed)
Patient ID: Carolyn Odonnell, female   DOB: Nov 26, 1919, 83 y.o.   MRN: [id number]                                                                PROGRESS NOTE                                                                                                                                                                                                             Patient Demographics:    Carolyn Odonnell, is a 83 y.o. female, DOB - Jul 18, 1919, SEG:[id number]  Admit date - 10/19/2016   Admitting Physician Vianne Bulls, MD  Outpatient Primary MD for the patient is Kandice Hams, MD  LOS - 2  Outpatient Specialists:   Chief Complaint  Patient presents with  . Weakness  . Fall       Brief Narrative   83 y.o.femalewith medical history significant fordementia, hypertension, GERD, and transfusion-dependent anemia who presents from her nursing home with slurred speech, pallor, and a fall. She was admitted to the stepdown unit for ongoing evaluation and management of symptomatic anemia with acute kidney injury superimposed on chronic kidney disease stage IV, elevated troponin likely secondary to anemia and hypotension, lactic acidosis, possibly reflecting an infectious process versus poor perfusion in the setting of anemia and hypotension.   Subjective:    Adasha Emberson today has no brbpr.  bp improved. Pt speech difficult to understand but denies pain.   No headache, No chest pain, No abdominal pain - No Nausea, No new weakness tingling or numbness, No Cough - SOB.    Assessment  & Plan :    Principal Problem:   Symptomatic anemia Active Problems:   Leucocytosis   HTN (hypertension)   CKD (chronic kidney disease), stage IV ([facility])   AKI (acute kidney injury) ([facility])   Slurred speech   Hyperkalemia   NSTEMI (non-ST elevated myocardial infarction) ([facility])   Anemia   Thrombocytopenia ([facility])   Lactic acidosis   Hyperbilirubinemia   Abnormal transaminases   Elevated liver function tests   Elevated  troponin   Palliative care by specialist   1. Symptomatic anemia, thrombocytopenia  - Pt is followed by oncology for blood cell dyscrasia - She was noted to have possible paraproteinemia, but her son/POA elected to avoid  any further diagnostic intervention  - She has been managed with B-12 and iron supplementation, weekly dexamethasone, and has been requiring transfusions  - Last transfused early February 2018  - Presents with pallor, soft BP, and Hgb of 5.4, down from 8.6 after transfusion in Feb '18  - There is no bleeding evident  - 2 units of pRBCs  Transfused  4/14   - cbc in am  2. Elevated troponin - No anginal complaints, and no apparent hx of CAD  - Initial trop 0.15 and EKG with ST-depressions inferiorly  - Enzyme trend has been flat, consistent with a demand ischemia in setting of symptomatic anemia with soft BP   3. Acute kidney injury superimposed on CKD stage IV  - SCr is 2.52 on admission, up from apparent baseline of 1.7 , now 2.33 - No obstructive process noted on renal US, which suggests medical renal disease  - Likely prerenal in setting of soft BP  - She was given 1.5 L NS in ED and is continued on IVF given her soft BP, but there is evidence for some pulmonary edema and she will likely need Lasix between units of blood  Check cmp in am  4. Slurred speech - Pt noted to have slurred speech on day of presentation by nursing home personel - Head CT is negative for acute intracranial abnormality  - Son at the bedside on admission suspected this was secondary to dry mouth and not wearing her dentures  - If this persists, or focal deficits are identified, could consider further eval with MRI if felt appropriate  Speech therapy recommended=>Dysphagia 1 (puree)/nectar-thick liquids, Medication Administration: Crushed with puree   5. Hyperkalemia  - Serum potassium is 5.8 on admission  Potassium down to 5.1 Check cmp in am  6. Hypertension  - BP has been soft  and her home medications (amlodipine, benazepril, and atenolol) are held initially  - Monitor and resume treatment as needed   7. GERD - No EGD report on file  - Managed with daily PPI at home, will continue   8. ?Sepsis  - Pt presents with WBC 17,400 and lactate of 8.33  - Urine is not suggestive of infection, abd exam benign, and CXR with suspected pulm edema but may represent infection  - Blood cultures no growth so far, she received 30 cc/kg NS, and empiric vancomycin and cefepime were started, day #2 - Given her critical illness and uncertain etiology for the impressive leukocytosis and lactate, will continue empiric broad-spectrums antibiotics pending culture, 4/13 =>  so far ngtd Check cbc in am, if wbc improving probably d/c vanco tomorrow   9. Hyperbilirubinemia, elevated transaminases  - AST and ALT are newly elevated to 225 and 123,repeat labs shows they are improving ,bilirubin 2.4>3.3 - No significant abd tenderness and RUQ notable for s/p cholecystectomy  - Likely acute liver injury in setting in soft BP and anemia, check CA19-9,CEA Son has expressed desire to be conservative in the past , USG shows she is s/p cholecystectomy     DVT prophylaxsis SCDs  Code Status:  Full code  Dispo: SNF          Lab Results  Component Value Date   PLT 62 (L) 10/20/2016    Antibiotics  :  vanco /cefepime 4/13=>  Anti-infectives    Start     Dose/Rate Route Frequency Ordered Stop   10/21/16 2300  vancomycin (VANCOCIN) 500 mg in sodium chloride 0.9 % 100 mL IVPB  500 mg 100 mL/hr over 60 Minutes Intravenous Every 48 hours 10/19/16 2219     10/20/16 2300  ceFEPIme (MAXIPIME) 500 mg in dextrose 5 % 50 mL IVPB     500 mg 100 mL/hr over 30 Minutes Intravenous Every 24 hours 10/19/16 2219     10/19/16 2230  ceFEPIme (MAXIPIME) 1 g in dextrose 5 % 50 mL IVPB     1 g 100 mL/hr over 30 Minutes Intravenous  Once 10/19/16 2219 10/20/16 0014   10/19/16 2200   vancomycin (VANCOCIN) IVPB 750 mg/150 ml premix     750 mg 150 mL/hr over 60 Minutes Intravenous  Once 10/19/16 2153 10/19/16 2324   10/19/16 2200  ceFEPIme (MAXIPIME) 2 g in dextrose 5 % 50 mL IVPB  Status:  Discontinued     2 g 100 mL/hr over 30 Minutes Intravenous  Once 10/19/16 2153 10/20/16 0154        Objective:   Vitals:   10/20/16 1624 10/20/16 2017 10/21/16 0457 10/21/16 0735  BP: 137/85 (!) 146/60 138/63 (!) 133/51  Pulse:  72 74 77  Resp:  (!) 30 (!) 22 (!) 28  Temp: 99 F (37.2 C) 98.4 F (36.9 C) 98.3 F (36.8 C) (!) 100.6 F (38.1 C)  TempSrc: Axillary Axillary Axillary Axillary  SpO2:  96% 95% 94%  Weight:   43.2 kg (95 lb 4.8 oz)     Wt Readings from Last 3 Encounters:  10/21/16 43.2 kg (95 lb 4.8 oz)  08/28/16 43.9 kg (96 lb 11.2 oz)  08/14/16 43.6 kg (96 lb 3.2 oz)     Intake/Output Summary (Last 24 hours) at 10/21/16 0802 Last data filed at 10/21/16 0600  Gross per 24 hour  Intake              410 ml  Output                0 ml  Net              410 ml     Physical Exam  Awake Alert, Oriented X 3, No new F.N deficits, Normal affect [place].AT,PERRAL Supple Neck,No JVD, No cervical lymphadenopathy appriciated.  Symmetrical Chest wall movement, Good air movement bilaterally, CTAB RRR,No Gallops,Rubs or new Murmurs, No Parasternal Heave +ve B.Sounds, Abd Soft, No tenderness, No organomegaly appriciated, No rebound - guarding or rigidity. No Cyanosis, Clubbing or edema, No new Rash or bruise      Data Review:    CBC  Recent Labs Lab 10/19/16 2126 10/20/16 1127 10/20/16 1823  WBC 17.4* 24.9*  --   HGB 5.4* 7.1* 8.5*  HCT 17.5* 22.2* 25.5*  PLT 61* 62*  --   MCV 114.4* 105.2*  --   MCH 35.3* 33.6  --   MCHC 30.9 32.0  --   RDW 22.7* 25.5*  --   LYMPHSABS 7.5*  --   --   MONOABS 2.3*  --   --   EOSABS 0.0  --   --   BASOSABS 0.0  --   --     Chemistries   Recent Labs Lab 10/19/16 2126 10/20/16 0107 10/20/16 0315 10/20/16 0711  10/20/16 1127 10/20/16 1823  NA 142 142  --   --   --   --   K 5.8* 5.8*  5.7* 5.4* 5.2* 5.1 3.9  CL 110 114*  --   --   --   --   CO2 13* 12*  --   --   --   --  GLUCOSE 121* 148*  --   --   --   --   BUN 61* 62*  --   --   --   --   CREATININE 2.52* 2.33*  --   --   --   --   CALCIUM 10.3 9.3  --   --   --   --   AST 225* <5*  --   --   --   --   ALT 123* 164*  --   --   --   --   ALKPHOS 102 125  --   --   --   --   BILITOT 2.4* 3.3*  --   --   --   --    ------------------------------------------------------------------------------------------------------------------ No results for input(s): CHOL, HDL, LDLCALC, TRIG, CHOLHDL, LDLDIRECT in the last 72 hours.  No results found for: HGBA1C ------------------------------------------------------------------------------------------------------------------ No results for input(s): TSH, T4TOTAL, T3FREE, THYROIDAB in the last 72 hours.  Invalid input(s): FREET3 ------------------------------------------------------------------------------------------------------------------ No results for input(s): VITAMINB12, FOLATE, FERRITIN, TIBC, IRON, RETICCTPCT in the last 72 hours.  Coagulation profile No results for input(s): INR, PROTIME in the last 168 hours.  No results for input(s): DDIMER in the last 72 hours.  Cardiac Enzymes  Recent Labs Lab 10/20/16 0107 10/20/16 0711 10/20/16 1127  TROPONINI 0.10* 0.10* 0.13*   ------------------------------------------------------------------------------------------------------------------ No results found for: BNP  Inpatient Medications  Scheduled Meds: . sodium chloride   Intravenous Once  . ceFEPime (MAXIPIME) IV  500 mg Intravenous Q24H  . cholecalciferol  2,000 Units Oral q morning - 10a  . feeding supplement (ENSURE ENLIVE)  237 mL Oral TID BM  . mirtazapine  15 mg Oral QHS  . pantoprazole (PROTONIX) IV  40 mg Intravenous Q12H  . PARoxetine  40 mg Oral Daily  . senna-docusate   2 tablet Oral Daily  . vancomycin  500 mg Intravenous Q48H  . vitamin B-12  1,000 mcg Oral Daily   Continuous Infusions: PRN Meds:.acetaminophen, ALPRAZolam, nitroGLYCERIN, ondansetron (ZOFRAN) IV  Micro Results Recent Results (from the past 240 hour(s))  Blood Culture (routine x 2)     Status: None (Preliminary result)   Collection Time: 10/19/16  9:26 PM  Result Value Ref Range Status   Specimen Description BLOOD LEFT ANTECUBITAL  Final   Special Requests   Final    BOTTLES DRAWN AEROBIC AND ANAEROBIC Blood Culture adequate volume   Culture NO GROWTH < 24 HOURS  Final   Report Status PENDING  Incomplete  Blood Culture (routine x 2)     Status: None (Preliminary result)   Collection Time: 10/19/16  9:31 PM  Result Value Ref Range Status   Specimen Description BLOOD RIGHT ANTECUBITAL  Final   Special Requests   Final    BOTTLES DRAWN AEROBIC AND ANAEROBIC Blood Culture adequate volume   Culture NO GROWTH < 24 HOURS  Final   Report Status PENDING  Incomplete  MRSA PCR Screening     Status: None   Collection Time: 10/20/16 12:56 AM  Result Value Ref Range Status   MRSA by PCR NEGATIVE NEGATIVE Final    Comment:        The GeneXpert MRSA Assay (FDA approved for NASAL specimens only), is one component of a comprehensive MRSA colonization surveillance program. It is not intended to diagnose MRSA infection nor to guide or monitor treatment for MRSA infections.     Radiology Reports Dg Chest 2 View  Result Date: 10/19/2016 CLINICAL DATA:  Status post fall,  with tachypnea and shallow respirations. Decreased O2 saturation. Initial encounter. EXAM: CHEST  2 VIEW COMPARISON:  Chest radiograph performed 12/23/2011 FINDINGS: The lungs are well-aerated. Mild vascular congestion is noted. Mild central airspace opacities could reflect infection or interstitial edema. A small left pleural effusion is noted. There is no evidence of pneumothorax. The heart is borderline enlarged. No acute  osseous abnormalities are seen. IMPRESSION: Mild vascular congestion and borderline cardiomegaly. Mild central airspace opacities could reflect infection or interstitial edema. Small left pleural effusion. Electronically Signed   By: Garald Balding M.D.   On: 10/19/2016 22:16   Ct Head Wo Contrast  Result Date: 10/19/2016 CLINICAL DATA:  Patient fell last night with confusion and slurring of speech this morning EXAM: CT HEAD WITHOUT CONTRAST TECHNIQUE: Contiguous axial images were obtained from the base of the skull through the vertex without intravenous contrast. COMPARISON:  Head CT from 02/04/2015 FINDINGS: BRAIN: There is sulcal and ventricular prominence consistent with superficial and central atrophy. No intraparenchymal hemorrhage, mass effect nor midline shift. Periventricular and subcortical white matter hypodensities consistent with chronic small vessel ischemic disease are identified. No acute large vascular territory infarcts. No abnormal extra-axial fluid collections. Basal cisterns are not effaced and midline. VASCULAR: Moderate calcific atherosclerosis of the carotid siphons. SKULL: No skull fracture. No significant scalp soft tissue swelling. Degenerate changes of the skullbase and upper cervical spine. SINUSES/ORBITS: The mastoid air-cells are clear. The included paranasal sinuses are well-aerated.The included ocular globes and orbital contents are non-suspicious. Bilateral lens replacements surgeries are noted. OTHER: None. IMPRESSION: Atrophy with chronic small vessel ischemic disease of periventricular white matter. No acute intracranial abnormality is noted. Electronically Signed   By: Ashley Royalty M.D.   On: 10/19/2016 22:36   US Abdomen Complete  Result Date: 10/20/2016 CLINICAL DATA:  Unspecified disorder of liver function with elevated liver function tests, acute renal injury. History of cholecystectomy, hysterectomy and colon surgery. EXAM: ABDOMEN ULTRASOUND COMPLETE COMPARISON:  CT  from 12/24/2011 FINDINGS: Gallbladder: Surgically absent Common bile duct: Diameter: 9 mm Liver: No space-occupying mass or biliary dilatation. Small amount of free fluid is noted surrounding liver. IVC: No abnormality visualized. Pancreas: Visualized portion unremarkable. Spleen: The spleen is mildly enlarged and measures 11.4 x 9.2 x 8.6 cm for a volume of 470 cubic cm. Right Kidney: Length: 9.1 cm. Echogenicity within increased. There is an echogenic noncalcified upper pole lesion consistent with a small angiomyolipoma. This measures approximately 1.3 x 0.9 x 1.2 cm. No nephrolithiasis nor obstructive uropathy. Left Kidney: Length: 9.2 cm with increased echogenicity. No mass or hydronephrosis visualized. Abdominal aorta: No aneurysm visualized.  Aortic atherosclerosis. Other findings: Left greater than right partially visualized small pleural effusions. IMPRESSION: 1. Status post cholecystectomy. 2. 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 Increased bilateral renal echogenicity consistent with medical renal disease. No obstructive uropathy. 3. Small amount of ascites adjacent to the liver. 4. Partially visualized pleural effusions bilaterally left greater than right. 5. Aortic atherosclerosis. Electronically Signed   By: Ashley Royalty M.D.   On: 10/20/2016 03:32   Dg Knee Complete 4 Views Left  Result Date: 10/19/2016 CLINICAL DATA:  Fall with knee pain EXAM: LEFT KNEE - COMPLETE 4+ VIEW COMPARISON:  None. FINDINGS: The bones are osteopenic. There is no acute fracture or dislocation at the left knee. There is mild chondrocalcinosis within the vertebral joint spaces. Prominent vascular calcifications are noted. No knee effusion. IMPRESSION: No acute fracture or dislocation of the left  knee. Medial and lateral femorotibial compartment chondrocalcinosis. Electronically Signed   By: Ulyses Jarred M.D.   On: 10/19/2016 22:13    Time Spent in minutes  30   Jani Gravel M.D on 10/21/2016  at 8:02 AM  Between 7am to 7pm - Pager - [phone]  After 7pm go to [email or web address] - password [facility]  [facility] -  Office  [phone]

## 2016-10-21 NOTE — Evaluation (Signed)
Clinical/Bedside Swallow Evaluation Patient Details  Name: RAYNEE MCCASLAND MRN: [id number] Date of Birth: [birth year]  Today's Date: 10/21/2016 Time: SLP Start Time (ACUTE ONLY): 4128 SLP Stop Time (ACUTE ONLY): 7867 SLP Time Calculation (min) (ACUTE ONLY): 17 min  Past Medical History:  Past Medical History:  Diagnosis Date  . Anxiety   . Arthritis   . Blood transfusion without reported diagnosis   . Chronic headache   . Constipation   . Depression   . GERD (gastroesophageal reflux disease)   . Hypertension   . Osteoporosis    Past Surgical History:  Past Surgical History:  Procedure Laterality Date  . ABDOMINAL HYSTERECTOMY    . CHOLECYSTECTOMY    . COLON SURGERY     HPI:   On 10/19/16, 83 y.o. female with medical history significant for dementia, hypertension, GERD, and transfusion-dependent anemia who presents from her nursing home with slurred speech, pallor, and a fall. Patient had reportedly fallen sometime early this morning at the nursing home with no apparent injury and was helped back into bed. Later in the morning, she is noted to have slurred he should and appeared pale and acutely ill. EMS was contacted for transport to the hospital. There have been no fevers reported at the nursing home and the patient had not been voicing any complaint. She did complain of some right knee pain after the fall and a superficial abrasion was noted, but no other complaints at been made and she had not appeared to be in any respiratory distress. There has not been any vomiting or diarrhea noted. She does have chronic dementia and history is therefore Augmentin through discussion with the patient's son at the bedside. He reports a history of transfusion dependent anemia for which she was last transfused less than a month ago. She has been evaluated by oncology for this, but no further diagnostic investigation was pursued given the patient's age and comorbidity.     Assessment / Plan /  Recommendation Clinical Impression   Pt with oropharyngeal dysphagia characterized by oral holding, prolonged oral transit, poor awareness of bolus and suspected delay in the initiation of the swallow which may be partially related to cognitive status; delayed throat clearing noted with puree/larger volumes of thin liquids; nectar-thickened liquids eliminated delayed throat clearing; pt required frequent verbal cueing to swallow various consistencies throughout BSE d/t oral holding; swallow precautions and aspiration precautions recommended with Dysphagia 1 (puree)/nectar-thickened liquids; ST will f/u while in house for diet tolerance/education re: precautions. SLP Visit Diagnosis: Dysphagia, oropharyngeal phase (R13.12)    Aspiration Risk  Mild aspiration risk;Moderate aspiration risk    Diet Recommendation   Dysphagia 1 (puree)/nectar-thick liquids  Medication Administration: Crushed with puree    Other  Recommendations Oral Care Recommendations: Oral care BID Other Recommendations: Order thickener from pharmacy   Follow up Recommendations Skilled Nursing facility      Frequency and Duration min 2x/week  1 week       Prognosis Prognosis for Safe Diet Advancement: Good Barriers to Reach Goals: Cognitive deficits      Swallow Study   General Date of Onset: 10/19/16 HPI:  83 y.o. female with medical history significant for dementia, hypertension, GERD, and transfusion-dependent anemia who presents from her nursing home with slurred speech, pallor, and a fall. Patient had reportedly fallen sometime early this morning at the nursing home with no apparent injury and was helped back into bed. Later in the morning, she is noted to have slurred he should and appeared  pale and acutely ill. EMS was contacted for transport to the hospital. There have been no fevers reported at the nursing home and the patient had not been voicing any complaint. She did complain of some right knee pain after the  fall and a superficial abrasion was noted, but no other complaints at been made and she had not appeared to be in any respiratory distress. There has not been any vomiting or diarrhea noted. She does have chronic dementia and history is therefore Augmentin through discussion with the patient's son at the bedside. He reports a history of transfusion dependent anemia for which she was last transfused less than a month ago. She has been evaluated by oncology for this, but no further diagnostic investigation was pursued given the patient's age and comorbidity.   Type of Study: Bedside Swallow Evaluation Diet Prior to this Study: Thin liquids Temperature Spikes Noted: Yes Respiratory Status: Nasal cannula History of Recent Intubation: No Behavior/Cognition: Alert;Confused Oral Cavity Assessment: Within Functional Limits Oral Care Completed by SLP: Recent completion by staff Oral Cavity - Dentition: Poor condition;Missing dentition [place]-Feeding Abilities: Needs assist;Needs set up Patient Positioning: Upright in bed Baseline Vocal Quality: Low vocal intensity Volitional Cough: Cognitively unable to elicit Volitional Swallow: Unable to elicit    Oral/Motor/Sensory Function Overall Oral Motor/Sensory Function: Other (comment) (unable to fully assess d/t cognitive status; gen weakness)   Ice Chips Ice chips: Impaired Presentation: Spoon Oral Phase Functional Implications: Oral holding Pharyngeal Phase Impairments: Suspected delayed Swallow   Thin Liquid Thin Liquid: Impaired Presentation: Cup;Spoon Oral Phase Impairments: Poor awareness of bolus Oral Phase Functional Implications: Prolonged oral transit;Oral holding Pharyngeal  Phase Impairments: Suspected delayed Swallow;Throat Clearing - Delayed    Nectar Thick Nectar Thick Liquid: Impaired Presentation: Cup;Spoon Oral phase functional implications: Oral holding Pharyngeal Phase Impairments: Suspected delayed Swallow   Honey Thick Honey Thick  Liquid: Not tested   Puree Puree: Impaired Presentation: Spoon Oral Phase Functional Implications: Prolonged oral transit;Oral holding Pharyngeal Phase Impairments: Suspected delayed Swallow;Throat Clearing - Delayed   Solid      Solid: Not tested    Functional Assessment Tool Used: NOMS Functional Limitations: Swallowing Swallow Current Status (C4888): At least 20 percent but less than 40 percent impaired, limited or restricted Swallow Goal Status [phone]): At least 1 percent but less than 20 percent impaired, limited or restricted   Elvina Sidle, M.S., CCC-SLP 10/21/2016,11:06 AM

## 2016-10-22 ENCOUNTER — Inpatient Hospital Stay (HOSPITAL_COMMUNITY): Payer: Medicare Other

## 2016-10-22 DIAGNOSIS — R7989 Other specified abnormal findings of blood chemistry: Secondary | ICD-10-CM

## 2016-10-22 LAB — COMPREHENSIVE METABOLIC PANEL
ALT: 164 U/L — ABNORMAL HIGH (ref 14–54)
ALT: 175 U/L — AB (ref 14–54)
AST: 329 U/L — ABNORMAL HIGH (ref 15–41)
AST: 163 U/L — AB (ref 15–41)
Albumin: 3.4 g/dL — ABNORMAL LOW (ref 3.5–5.0)
Albumin: 3.3 g/dL — ABNORMAL LOW (ref 3.5–5.0)
Alkaline Phosphatase: 125 U/L (ref 38–126)
Alkaline Phosphatase: 97 U/L (ref 38–126)
Anion gap: 16 — ABNORMAL HIGH (ref 5–15)
Anion gap: 13 (ref 5–15)
BUN: 62 mg/dL — ABNORMAL HIGH (ref 6–20)
BUN: 51 mg/dL — ABNORMAL HIGH (ref 6–20)
CHLORIDE: 114 mmol/L — AB (ref 101–111)
CHLORIDE: 115 mmol/L — AB (ref 101–111)
CO2: 12 mmol/L — ABNORMAL LOW (ref 22–32)
CO2: 20 mmol/L — AB (ref 22–32)
CREATININE: 1.29 mg/dL — AB (ref 0.44–1.00)
Calcium: 9.3 mg/dL (ref 8.9–10.3)
Calcium: 9.5 mg/dL (ref 8.9–10.3)
Creatinine, Ser: 2.33 mg/dL — ABNORMAL HIGH (ref 0.44–1.00)
GFR calc Af Amer: 39 mL/min — ABNORMAL LOW (ref >60)
GFR calc non Af Amer: 34 mL/min — ABNORMAL LOW (ref >60)
GFR, EST AFRICAN AMERICAN: 19 mL/min — AB (ref >60)
GFR, EST NON AFRICAN AMERICAN: 17 mL/min — AB (ref >60)
Glucose, Bld: 148 mg/dL — ABNORMAL HIGH (ref 65–99)
Glucose, Bld: 146 mg/dL — ABNORMAL HIGH (ref 65–99)
POTASSIUM: 2.6 mmol/L — AB (ref 3.5–5.1)
Potassium: 5.8 mmol/L — ABNORMAL HIGH (ref 3.5–5.1)
SODIUM: 142 mmol/L (ref 135–145)
SODIUM: 148 mmol/L — AB (ref 135–145)
Total Bilirubin: 3.3 mg/dL — ABNORMAL HIGH (ref 0.3–1.2)
Total Bilirubin: 2.7 mg/dL — ABNORMAL HIGH (ref 0.3–1.2)
Total Protein: 6.3 g/dL — ABNORMAL LOW (ref 6.5–8.1)
Total Protein: 6.3 g/dL — ABNORMAL LOW (ref 6.5–8.1)

## 2016-10-22 LAB — CBC
HCT: 24.8 % — ABNORMAL LOW (ref 36.0–46.0)
Hemoglobin: 8.2 g/dL — ABNORMAL LOW (ref 12.0–15.0)
MCH: 33.7 pg (ref 26.0–34.0)
MCHC: 33.1 g/dL (ref 30.0–36.0)
MCV: 102.1 fL — ABNORMAL HIGH (ref 78.0–100.0)
PLATELETS: 49 10*3/uL — AB (ref 150–400)
RBC: 2.43 MIL/uL — AB (ref 3.87–5.11)
RDW: 25.3 % — ABNORMAL HIGH (ref 11.5–15.5)
WBC: 12.9 10*3/uL — AB (ref 4.0–10.5)

## 2016-10-22 LAB — MAGNESIUM: MAGNESIUM: 2.3 mg/dL (ref 1.7–2.4)

## 2016-10-22 LAB — ECHOCARDIOGRAM COMPLETE: Weight: 1601.6 oz

## 2016-10-22 LAB — PATHOLOGIST SMEAR REVIEW

## 2016-10-22 MED ORDER — HYDROMORPHONE HCL 1 MG/ML IJ SOLN: 0.2500 mg | INTRAMUSCULAR | Status: DC | PRN

## 2016-10-22 MED ORDER — POTASSIUM CHLORIDE CRYS ER 20 MEQ PO TBCR
40.0000 meq | EXTENDED_RELEASE_TABLET | Freq: Three times a day (TID) | ORAL | Status: DC
Start: 2016-10-22 — End: 2016-10-23
  Administered 2016-10-22 (×3): 40 meq via ORAL
  Filled 2016-10-22 (×3): qty 2

## 2016-10-22 MED ORDER — SODIUM CHLORIDE 0.9 % IV SOLN: 30.0000 meq | Freq: Once | INTRAVENOUS | Status: DC

## 2016-10-22 MED ORDER — SODIUM CHLORIDE 0.9 % IV SOLN
30.0000 meq | INTRAVENOUS | Status: AC
  Administered 2016-10-22 (×2): 30 meq via INTRAVENOUS
  Filled 2016-10-22 (×2): qty 15

## 2016-10-22 MED ORDER — MAGNESIUM SULFATE 2 GM/50ML IV SOLN
2.0000 g | Freq: Once | INTRAVENOUS | Status: DC
  Filled 2016-10-22: qty 50

## 2016-10-22 MED ORDER — POTASSIUM CHLORIDE IN NACL 20-0.45 MEQ/L-% IV SOLN
INTRAVENOUS | Status: DC
  Filled 2016-10-22: qty 1000

## 2016-10-22 MED ORDER — POTASSIUM CHLORIDE CRYS ER 20 MEQ PO TBCR
40.0000 meq | EXTENDED_RELEASE_TABLET | Freq: Once | ORAL | Status: AC
Start: 2016-10-22 — End: 2016-10-22
  Administered 2016-10-22: 40 meq via ORAL
  Filled 2016-10-22: qty 2

## 2016-10-22 MED ORDER — ONDANSETRON 4 MG PO TBDP
4.0000 mg | ORAL_TABLET | Freq: Three times a day (TID) | ORAL | Status: DC | PRN
  Filled 2016-10-22: qty 1

## 2016-10-22 NOTE — Progress Notes (Signed)
Provider on call Lamar Blinks NP notified via [facility] text page critical K 2.6. Awaiting orders. .me

## 2016-10-22 NOTE — Progress Notes (Signed)
[facility] PROGRESS NOTE  Carolyn Odonnell YTK:[id number] DOB: 1919-08-23 DOA: 10/19/2016   PCP: Kandice Hams, MD     Assessment/Plan: Principal Problem:   Symptomatic anemia Active Problems:   Leucocytosis   HTN (hypertension)   CKD (chronic kidney disease), stage IV (HCC)   AKI (acute kidney injury) ([place])   Slurred speech   Hyperkalemia   NSTEMI (non-ST elevated myocardial infarction) (HCC)   Anemia   Thrombocytopenia (HCC)   Lactic acidosis   Hyperbilirubinemia   Abnormal transaminases   Elevated liver function tests   Elevated troponin   Palliative care by specialist    83 y.o. female with medical history significant for dementia, hypertension, GERD, and transfusion-dependent anemia who presents from her nursing home with slurred speech, pallor, and a fall. She was admitted to the stepdown unit for ongoing evaluation and management of symptomatic anemia with acute kidney injury superimposed on chronic kidney disease stage IV, elevated troponin likely secondary to anemia and hypotension, lactic acidosis, possibly reflecting an infectious process versus poor perfusion in the setting of anemia and hypotension.  Assessment and plan Probable pneumonia Initial chest x-ray 4/13, showed central airspace opacity, infection versus edema Patient has been on Vanco and cefepime since 4/13 Blood cultures no growth so far Discontinue vancomycin Repeat chest x-ray today, continues to have low-grade fever Dysphagia 1 (puree)/nectar-thick liquids    Symptomatic anemia, thrombocytopenia   -likely has myelodysplastic syndrome - Pt is followed by oncology for blood cell dyscrasia - She was noted to have possible paraproteinemia, but her son/POA elected to avoid any further diagnostic intervention  - She has been managed with B-12 and iron supplementation, weekly dexamethasone, and has been requiring transfusions  - Last transfused early February 2018  Presented with Hgb of  5.4, status post 2 units, hemoglobin now 8.2 Platelet count worsening 62>49 Currently no active bleeding Follow CBC closely    Elevated troponin - No anginal complaints, and no apparent hx of CAD  - Initial trop 0.15 and EKG with ST-depressions inferiorly  - Enzyme trend has been flat, consistent with a demand ischemia in setting of symptomatic anemia with soft BP  Keep hemoglobin greater than 8.0 Echo , suspect wall motion abnormalities and low EF   Acute kidney injury superimposed on CKD stage IV  Creatinine at baseline of 1.7 , was 2.55, now 1.29 - No obstructive process noted on renal US, which suggests medical renal disease  Status post volume resuscitation, with normal saline and blood Held ACE inhibitor Status post Kayexalate for hyperkalemia (k 5.8), patient is now hypokalemic, replete potassium    Slurred speech - Pt noted to have slurred speech on day of presentation by nursing home personel - Head CT is negative for acute intracranial abnormality  - Son at the bedside suspects this is secondary to dry mouth and not wearing her dentures  - If this persists, or focal deficits are identified, could consider further eval with MRI if felt appropriate     Hypernatremia Suspect poor oral intake Sodium has increased from 142>148 Started on half normal saline    Hypertension  - BP has been soft and her home medications (amlodipine, benazepril, and atenolol) are held initially  - Monitor and resume treatment as needed    GERD - No EGD report on file  - Managed with daily PPI at home, will continue   Probable Sepsis    Pt presents with WBC 17,400 and lactate of 8.33   Urine  is not suggestive of infection, abd exam benign, and CXR with suspected pulm edema but may represent infection   Blood cultures no growth so far, received fluids per sepsis protocol  30 cc/kg NS,   empiric vancomycin and cefepime were started,    Likely source of lung   Hyperbilirubinemia,  elevated transaminases  Patient found to have elevated AST, ALT, bilirubin all improving - No significant abd tenderness and RUQ notable for s/p cholecystectomy  - Likely acute liver injury in setting in soft BP and anemia, check CA19-9,CEA Son has expressed desire to be conservative in the past ,  USG shows she is s/p cholecystectomy     DVT prophylaxsis SCDs  Code Status:  Full code    Family Communication: Discussed with son by the bedside, overall prognosis poor,    Disposition Plan: Strongly recommend hospice at this time  Consultants:  Palliative care  Procedures:  None  Antibiotics: Anti-infectives    Start     Dose/Rate Route Frequency Ordered Stop   10/21/16 2300  vancomycin (VANCOCIN) 500 mg in sodium chloride 0.9 % 100 mL IVPB     500 mg 100 mL/hr over 60 Minutes Intravenous Every 48 hours 10/19/16 2219     10/20/16 2300  ceFEPIme (MAXIPIME) 500 mg in dextrose 5 % 50 mL IVPB     500 mg 100 mL/hr over 30 Minutes Intravenous Every 24 hours 10/19/16 2219     10/19/16 2230  ceFEPIme (MAXIPIME) 1 g in dextrose 5 % 50 mL IVPB     1 g 100 mL/hr over 30 Minutes Intravenous  Once 10/19/16 2219 10/20/16 0014   10/19/16 2200  vancomycin (VANCOCIN) IVPB 750 mg/150 ml premix     750 mg 150 mL/hr over 60 Minutes Intravenous  Once 10/19/16 2153 10/19/16 2324   10/19/16 2200  ceFEPIme (MAXIPIME) 2 g in dextrose 5 % 50 mL IVPB  Status:  Discontinued     2 g 100 mL/hr over 30 Minutes Intravenous  Once 10/19/16 2153 10/20/16 0154         HPI/Subjective: Confused , muffled speech, low-grade fever this morning 100.6  Objective: Vitals:   10/22/16 0040 10/22/16 0247 10/22/16 0255 10/22/16 0750  BP: (!) 151/74 (!) 146/60    Pulse: 95  93 99  Resp: (!) 21  (!) 33   Temp: 97.5 F (36.4 C)  97.6 F (36.4 C) 99.4 F (37.4 C)  TempSrc: Oral  Axillary Oral  SpO2: 95%  94%   Weight:   45.4 kg (100 lb 1.6 oz)     Intake/Output Summary (Last 24 hours) at 10/22/16  0930 Last data filed at 10/22/16 3810  Gross per 24 hour  Intake              610 ml  Output                0 ml  Net              610 ml    Exam:  Examination:  General exam: frail and confused  Respiratory system: Clear to auscultation. Respiratory effort normal. Cardiovascular system: S1 & S2 heard, RRR. No JVD, murmurs, rubs, gallops or clicks. No pedal edema. Gastrointestinal system: Abdomen is nondistended, soft and nontender. No organomegaly or masses felt. Normal bowel sounds heard. Central nervous system:  . No focal neurological deficits. Extremities: Symmetric 5 x 5 power. Skin: No rashes, lesions or ulcers Psychiatry: Judgement  imapired     Data Reviewed:  I have personally reviewed following labs and imaging studies  Micro Results Recent Results (from the past 240 hour(s))  Blood Culture (routine x 2)     Status: None (Preliminary result)   Collection Time: 10/19/16  9:26 PM  Result Value Ref Range Status   Specimen Description BLOOD LEFT ANTECUBITAL  Final   Special Requests   Final    BOTTLES DRAWN AEROBIC AND ANAEROBIC Blood Culture adequate volume   Culture NO GROWTH 2 DAYS  Final   Report Status PENDING  Incomplete  Blood Culture (routine x 2)     Status: None (Preliminary result)   Collection Time: 10/19/16  9:31 PM  Result Value Ref Range Status   Specimen Description BLOOD RIGHT ANTECUBITAL  Final   Special Requests   Final    BOTTLES DRAWN AEROBIC AND ANAEROBIC Blood Culture adequate volume   Culture NO GROWTH 2 DAYS  Final   Report Status PENDING  Incomplete  MRSA PCR Screening     Status: None   Collection Time: 10/20/16 12:56 AM  Result Value Ref Range Status   MRSA by PCR NEGATIVE NEGATIVE Final    Comment:        The GeneXpert MRSA Assay (FDA approved for NASAL specimens only), is one component of a comprehensive MRSA colonization surveillance program. It is not intended to diagnose MRSA infection nor to guide or monitor treatment  for MRSA infections.     Radiology Reports Dg Chest 2 View  Result Date: 10/19/2016 CLINICAL DATA:  Status post fall, with tachypnea and shallow respirations. Decreased O2 saturation. Initial encounter. EXAM: CHEST  2 VIEW COMPARISON:  Chest radiograph performed 12/23/2011 FINDINGS: The lungs are well-aerated. Mild vascular congestion is noted. Mild central airspace opacities could reflect infection or interstitial edema. A small left pleural effusion is noted. There is no evidence of pneumothorax. The heart is borderline enlarged. No acute osseous abnormalities are seen. IMPRESSION: Mild vascular congestion and borderline cardiomegaly. Mild central airspace opacities could reflect infection or interstitial edema. Small left pleural effusion. Electronically Signed   By: Garald Balding M.D.   On: 10/19/2016 22:16   Ct Head Wo Contrast  Result Date: 10/19/2016 CLINICAL DATA:  Patient fell last night with confusion and slurring of speech this morning EXAM: CT HEAD WITHOUT CONTRAST TECHNIQUE: Contiguous axial images were obtained from the base of the skull through the vertex without intravenous contrast. COMPARISON:  Head CT from 02/04/2015 FINDINGS: BRAIN: There is sulcal and ventricular prominence consistent with superficial and central atrophy. No intraparenchymal hemorrhage, mass effect nor midline shift. Periventricular and subcortical white matter hypodensities consistent with chronic small vessel ischemic disease are identified. No acute large vascular territory infarcts. No abnormal extra-axial fluid collections. Basal cisterns are not effaced and midline. VASCULAR: Moderate calcific atherosclerosis of the carotid siphons. SKULL: No skull fracture. No significant scalp soft tissue swelling. Degenerate changes of the skullbase and upper cervical spine. SINUSES/ORBITS: The mastoid air-cells are clear. The included paranasal sinuses are well-aerated.The included ocular globes and orbital contents are  non-suspicious. Bilateral lens replacements surgeries are noted. OTHER: None. IMPRESSION: Atrophy with chronic small vessel ischemic disease of periventricular white matter. No acute intracranial abnormality is noted. Electronically Signed   By: Ashley Royalty M.D.   On: 10/19/2016 22:36   US Abdomen Complete  Result Date: 10/20/2016 CLINICAL DATA:  Unspecified disorder of liver function with elevated liver function tests, acute renal injury. History of cholecystectomy, hysterectomy and colon surgery. EXAM: ABDOMEN ULTRASOUND COMPLETE COMPARISON:  CT from  12/24/2011 FINDINGS: Gallbladder: Surgically absent Common bile duct: Diameter: 9 mm Liver: No space-occupying mass or biliary dilatation. Small amount of free fluid is noted surrounding liver. IVC: No abnormality visualized. Pancreas: Visualized portion unremarkable. Spleen: The spleen is mildly enlarged and measures 11.4 x 9.2 x 8.6 cm for a volume of 470 cubic cm. Right Kidney: Length: 9.1 cm. Echogenicity within increased. There is an echogenic noncalcified upper pole lesion consistent with a small angiomyolipoma. This measures approximately 1.3 x 0.9 x 1.2 cm. No nephrolithiasis nor obstructive uropathy. Left Kidney: Length: 9.2 cm with increased echogenicity. No mass or hydronephrosis visualized. Abdominal aorta: No aneurysm visualized.  Aortic atherosclerosis. Other findings: Left greater than right partially visualized small pleural effusions. IMPRESSION: 1. Status post cholecystectomy. 2. 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 Increased bilateral renal echogenicity consistent with medical renal disease. No obstructive uropathy. 3. Small amount of ascites adjacent to the liver. 4. Partially visualized pleural effusions bilaterally left greater than right. 5. Aortic atherosclerosis. Electronically Signed   By: Ashley Royalty M.D.   On: 10/20/2016 03:32   Dg Knee Complete 4 Views Left  Result Date: 10/19/2016 CLINICAL DATA:   Fall with knee pain EXAM: LEFT KNEE - COMPLETE 4+ VIEW COMPARISON:  None. FINDINGS: The bones are osteopenic. There is no acute fracture or dislocation at the left knee. There is mild chondrocalcinosis within the vertebral joint spaces. Prominent vascular calcifications are noted. No knee effusion. IMPRESSION: No acute fracture or dislocation of the left knee. Medial and lateral femorotibial compartment chondrocalcinosis. Electronically Signed   By: Ulyses Jarred M.D.   On: 10/19/2016 22:13     CBC  Recent Labs Lab 10/19/16 2126 10/20/16 1127 10/20/16 1823 10/22/16 0336  WBC 17.4* 24.9*  --  12.9*  HGB 5.4* 7.1* 8.5* 8.2*  HCT 17.5* 22.2* 25.5* 24.8*  PLT 61* 62*  --  49*  MCV 114.4* 105.2*  --  102.1*  MCH 35.3* 33.6  --  33.7  MCHC 30.9 32.0  --  33.1  RDW 22.7* 25.5*  --  25.3*  LYMPHSABS 7.5*  --   --   --   MONOABS 2.3*  --   --   --   EOSABS 0.0  --   --   --   BASOSABS 0.0  --   --   --     Chemistries   Recent Labs Lab 10/19/16 2126 10/20/16 0107 10/20/16 0315 10/20/16 0711 10/20/16 1127 10/20/16 1823 10/22/16 0336  NA 142 142  --   --   --   --  148*  K 5.8* 5.8*  5.7* 5.4* 5.2* 5.1 3.9 2.6*  CL 110 114*  --   --   --   --  115*  CO2 13* 12*  --   --   --   --  20*  GLUCOSE 121* 148*  --   --   --   --  146*  BUN 61* 62*  --   --   --   --  51*  CREATININE 2.52* 2.33*  --   --   --   --  1.29*  CALCIUM 10.3 9.3  --   --   --   --  9.5  AST 225* 329*  --   --   --   --  163*  ALT 123* 164*  --   --   --   --  175*  ALKPHOS 102 125  --   --   --   --  97  BILITOT 2.4* 3.3*  --   --   --   --  2.7*   ------------------------------------------------------------------------------------------------------------------ estimated creatinine clearance is 18.7 mL/min (A) (by C-G formula based on SCr of 1.29 mg/dL (H)). ------------------------------------------------------------------------------------------------------------------ No results for input(s): HGBA1C in  the last 72 hours. ------------------------------------------------------------------------------------------------------------------ No results for input(s): CHOL, HDL, LDLCALC, TRIG, CHOLHDL, LDLDIRECT in the last 72 hours. ------------------------------------------------------------------------------------------------------------------ No results for input(s): TSH, T4TOTAL, T3FREE, THYROIDAB in the last 72 hours.  Invalid input(s): FREET3 ------------------------------------------------------------------------------------------------------------------ No results for input(s): VITAMINB12, FOLATE, FERRITIN, TIBC, IRON, RETICCTPCT in the last 72 hours.  Coagulation profile No results for input(s): INR, PROTIME in the last 168 hours.  No results for input(s): DDIMER in the last 72 hours.  Cardiac Enzymes  Recent Labs Lab 10/20/16 0107 10/20/16 0711 10/20/16 1127  TROPONINI 0.10* 0.10* 0.13*   ------------------------------------------------------------------------------------------------------------------ Invalid input(s): POCBNP   CBG: No results for input(s): GLUCAP in the last 168 hours.     Studies: No results found.    No results found for: HGBA1C Lab Results  Component Value Date   CREATININE 1.29 (H) 10/22/2016       Scheduled Meds: . ceFEPime (MAXIPIME) IV  500 mg Intravenous Q24H  . cholecalciferol  2,000 Units Oral q morning - 10a  . feeding supplement (ENSURE ENLIVE)  237 mL Oral TID BM  . magnesium sulfate 1 - 4 g bolus IVPB  2 g Intravenous Once  . mirtazapine  15 mg Oral QHS  . pantoprazole (PROTONIX) IV  40 mg Intravenous Q12H  . PARoxetine  40 mg Oral Daily  . potassium chloride (KCL MULTIRUN) 30 mEq in 265 mL IVPB  30 mEq Intravenous Q3H  . potassium chloride  40 mEq Oral TID  . senna-docusate  2 tablet Oral Daily  . vancomycin  500 mg Intravenous Q48H  . vitamin B-12  1,000 mcg Oral Daily   Continuous Infusions: . 0.45 % NaCl with KCl 20  mEq / L       LOS: 3 days    Time spent: >30 MINS    Reyne Dumas  [facility] Pager [phone]. If 7PM-7AM, please contact night-coverage at [email or web address], password [facility] 10/22/2016, 9:30 AM  LOS: 3 days

## 2016-10-22 NOTE — Progress Notes (Addendum)
Palliative Medicine RN Note: Daily follow up. Dr Allyson Sabal at bedside. Pt has myelodysplastic disease per recent testing.  Sat with son Carolyn Odonnell [phone]). He states that two weeks ago, pt began to decline. She started sleeping 20 hours per day and was using a walker. Labs are deranged. Hx dementia, HTN, CKD 4, NSTEMI, transfusion dependent anemia. She fell at home (her ALF) and now has slurred speech; she has not improved since admit.   Son is very clear that goal is comfort and dignity. He had good experiences with [facility] during his wife's death, and he would like pt to go to [facility] (discussed/offered choice; family has experience with [facility] and would like to use them). Antibiotics will be stopped, and in the presence of infection without treatment, life expectancy is days to weeks.   Referral called to BP rep.  Carolyn Skiff Trig Mcbryar, RN, BSN, [facility] 10/22/2016 11:29 AM Cell [phone] 8:00-4:00 Monday-Friday Office [phone]

## 2016-10-22 NOTE — Progress Notes (Signed)
[place] for Vanco/Cefepime Indication: Sepsis  Allergies  Allergen Reactions  . Zolpidem Other (See Comments)    Causing falls, disorientation, sleep walking events  . Codeine Other (See Comments)    unknown  . Doxycycline Other (See Comments)    unknown  . Fosamax [Alendronate Sodium] Other (See Comments)    unknown  . Morphine And Related Other (See Comments)    unknown  . Novocain [Procaine Hcl] Other (See Comments)    unknown  . Penicillins Other (See Comments)    unknown  . Percocet [Oxycodone-Acetaminophen] Other (See Comments)    unknown  . Skelaxin [Metaxalone] Other (See Comments)    unknown  . Sulfa Antibiotics Rash    Patient Measurements: Weight: 100 lb 1.6 oz (45.4 kg) Adjusted Body Weight:    Vital Signs: Temp: 99.4 F (37.4 C) (04/16 0750) Temp Source: Oral (04/16 0750) BP: 146/60 (04/16 0247) Pulse Rate: 99 (04/16 0750) Intake/Output from previous day: 04/15 0701 - 04/16 0700 In: 250 [P.O.:100; IV Piggyback:150] Out: -  Intake/Output from this shift: Total I/O In: 360 [P.O.:360] Out: -   Labs:  Recent Labs  10/19/16 2126 10/19/16 2350 10/20/16 0107 10/20/16 1127 10/20/16 1823 10/22/16 0336  WBC 17.4*  --   --  24.9*  --  12.9*  HGB 5.4*  --   --  7.1* 8.5* 8.2*  PLT 61*  --   --  62*  --  49*  LABCREA  --  118.23  --   --   --   --   CREATININE 2.52*  --  2.33*  --   --  1.29*   Estimated Creatinine Clearance: 18.7 mL/min (A) (by C-G formula based on SCr of 1.29 mg/dL (H)). No results for input(s): VANCOTROUGH, VANCOPEAK, VANCORANDOM, GENTTROUGH, GENTPEAK, GENTRANDOM, TOBRATROUGH, TOBRAPEAK, TOBRARND, AMIKACINPEAK, AMIKACINTROU, AMIKACIN in the last 72 hours.   Microbiology:   Medical History: Past Medical History:  Diagnosis Date  . Anxiety   . Arthritis   . Blood transfusion without reported diagnosis   . Chronic headache   . Constipation   . Depression   . GERD (gastroesophageal reflux  disease)   . Hypertension   . Osteoporosis    Assessment:  ID: D#4 Vanc/Cefepime for sepsis - lactate 8.33 > 5.7 > 3.5 - Tmax 100.6. Tc 99.4, wbc 12.9 down. Scr dropped significantly 2.33>1.29  4/13 Vanc >> 4/13 Cefepime >>  4/13 blood x2: ngtd 4/14 MRSA PCR: negative   Goal of Therapy:  Vancomycin trough level 15-20 mcg/ml  Plan:  Vanc 500mg  q48 dose ok Cefepime 500mg  q24 dose ok Follow renal function, cultures, LOT, level if needed   Carolyn Odonnell S. Alford Highland, PharmD, BCPS Clinical Staff Pharmacist Pager [phone]  Eilene Ghazi Stillinger 10/22/2016,8:40 AM

## 2016-10-22 NOTE — Progress Notes (Signed)
SLP Cancellation Note  Patient Details Name: Carolyn Odonnell MRN: [id number] DOB: 18-Jul-1919   Cancelled treatment:       Reason Eval/Treat Not Completed: Patient at procedure or test/unavailable   I spoke with nursing and she states that pt is consuming current diet without overt s/s of aspiration. Will plan to treat at next available time.   Ahlana Slaydon B. Rutherford Nail, M.S., CCC-SLP Speech-Language Pathologist  Nyles Mitton 10/22/2016, 10:32 AM

## 2016-10-22 NOTE — Progress Notes (Signed)
Clinical Education officer, museum received phone call from [facility]) and she stated she had received  a consult from Threasa Beards [facility] Palliative Team) requesting patients placemen at [facility]. [facility] stated that at this time they do not have beds available for patients. CSW remains available for support and discharge planing needs.  Rhea Pink, MSW,  [place]

## 2016-10-22 NOTE — Consult Note (Signed)
[facility] Liaison Received request from [place] for family interest in [facility]. Threasa Beards confirmed family is requesting [facility] and CSW consult has been ordered. Unfortunately [facility] is not able to offer a room today. At [facility] request, made patient's son aware via voice message. Spoke with CSW to make her aware of above. Will update CSW and family if availability changes.   Thank you,  Erling Conte, LCSW [phone]

## 2016-10-22 NOTE — Progress Notes (Signed)
Palliative Medicine RN Note: Discussed pt's allergies with son. He is unaware of true allergic reactions. Initiated very low dose dilaudid prn only after discussion w Dr Hilma Favors.  Marjie Skiff Chasiti Waddington, RN, BSN, [facility] 10/22/2016 11:53 AM Cell [phone] 8:00-4:00 Monday-Friday Office [phone]

## 2016-10-23 ENCOUNTER — Other Ambulatory Visit: Payer: Medicare Other

## 2016-10-23 ENCOUNTER — Ambulatory Visit: Payer: Medicare Other | Admitting: Hematology

## 2016-10-23 DIAGNOSIS — Z515 Encounter for palliative care: Secondary | ICD-10-CM

## 2016-10-23 DIAGNOSIS — R579 Shock, unspecified: Secondary | ICD-10-CM

## 2016-10-23 LAB — TRANSFUSION REACTION
DAT C3: NEGATIVE
Post RXN DAT IgG: POSITIVE

## 2016-10-23 LAB — CANCER ANTIGEN 19-9: CA 19 9: 1 U/mL (ref 0–35)

## 2016-10-23 LAB — CEA: CEA: 2.4 ng/mL (ref 0.0–4.7)

## 2016-10-23 MED ORDER — TRAMADOL HCL 50 MG PO TABS: 50.0000 mg | ORAL_TABLET | Freq: Four times a day (QID) | ORAL | 0 refills | Status: AC | PRN

## 2016-10-23 MED ORDER — ALPRAZOLAM 0.25 MG PO TABS: 0.2500 mg | ORAL_TABLET | Freq: Two times a day (BID) | ORAL | 0 refills | Status: AC | PRN

## 2016-10-23 NOTE — Care Management Important Message (Signed)
Important Message  Patient Details  Name: Carolyn Odonnell MRN: [id number] Date of Birth: 31-Dec-1919   Medicare Important Message Given:  Yes    Nathen May 10/23/2016, 11:38 AM

## 2016-10-23 NOTE — Consult Note (Signed)
[facility] room available this morning. Paper work completed with son. CSW [place] aware.  Please fax discharge summary to [phone]. RN Please call report to [phone]. Thank you,  Erling Conte, LCSW [phone]

## 2016-10-23 NOTE — Care Management Note (Signed)
Case Management Note  Patient Details  Name: CHINYERE GALIANO MRN: [id number] Date of Birth: May 14, 1920  Subjective/Objective:   Pt presented for Symptomatic Anemia. Plan will be for D/c to [facility] 10-23-16.                  Action/Plan: CSW assisting with disposition needs. No further needs from CM at this time.   Expected Discharge Date:  10/23/16               Expected Discharge Plan:  [place]  In-House Referral:  Clinical Social Work  Discharge planning Services  CM Consult  Post Acute Care Choice:  NA Choice offered to:  NA  DME Arranged:  N/A DME Agency:  NA  HH Arranged:  NA HH Agency:  NA  Status of Service:  Completed, signed off  If discussed at [employer] of Stay Meetings, dates discussed:    Additional Comments:  Bethena Roys, RN 10/23/2016, 12:42 PM

## 2016-10-23 NOTE — Discharge Summary (Signed)
Physician Discharge Summary  Carolyn Odonnell MRN: [id number] DOB/AGE: 81-13-1921 81 y.o.  PCP: Kandice Hams, MD   Admit date: 10/19/2016 Discharge date: 10/23/2016  Discharge Diagnoses:    Principal Problem:   Symptomatic anemia Active Problems:   Leucocytosis   HTN (hypertension)   CKD (chronic kidney disease), stage IV (HCC)   AKI (acute kidney injury) ([place])   Slurred speech   Hyperkalemia   NSTEMI (non-ST elevated myocardial infarction) (HCC)   Anemia   Thrombocytopenia (HCC)   Lactic acidosis   Hyperbilirubinemia   Abnormal transaminases   Elevated liver function tests   Elevated troponin   Palliative care by specialist   [facility])   Terminal care    Follow-up recommendations Patient is being discharged to [facility]         Current Discharge Medication List    START taking these medications   Details  ALPRAZolam (XANAX) 0.25 MG tablet Take 1 tablet (0.25 mg total) by mouth 2 (two) times daily as needed for anxiety. Qty: 30 tablet, Refills: 0      CONTINUE these medications which have CHANGED   Details  traMADol (ULTRAM) 50 MG tablet Take 1 tablet (50 mg total) by mouth every 6 (six) hours as needed. Qty: 30 tablet, Refills: 0      CONTINUE these medications which have NOT CHANGED   Details  acetaminophen (TYLENOL) 500 MG tablet Take 500 mg by mouth every 6 (six) hours as needed for moderate pain.    atenolol (TENORMIN) 25 MG tablet Take 25 mg by mouth every morning.     b complex vitamins capsule Take 1 capsule by mouth daily. Qty: 30 capsule, Refills: 2    cholecalciferol (VITAMIN D) 1000 UNITS tablet Take 2,000 Units by mouth every morning.    Cyanocobalamin (B-12) 1000 MCG SUBL Place 1,000 mcg under the tongue daily. Qty: 30 each, Refills: 3    dexamethasone (DECADRON) 4 MG tablet Take 5 tablets (20 mg total) by mouth once a week. With food (for plasma cell dyscrasia) Qty: 20 tablet, Refills: 1    ENSURE (ENSURE) Take 237  mLs by mouth 3 (three) times daily with meals.     ferrous sulfate 325 (65 FE) MG tablet Take 325 mg by mouth 2 (two) times daily.     metroNIDAZOLE (METROGEL) 1 % gel Apply 1 application topically daily.    mirtazapine (REMERON) 15 MG tablet Take 15 mg by mouth at bedtime.     omeprazole (PRILOSEC) 20 MG capsule Take 20 mg by mouth every morning.    PARoxetine (PAXIL) 40 MG tablet Take 40 mg by mouth daily.    sennosides-docusate sodium (SENOKOT-S) 8.6-50 MG tablet Take 2 tablets by mouth daily.    Skin Protectants, Misc. (EUCERIN) cream Apply 1 application topically 2 (two) times daily as needed for dry skin (to feet).      STOP taking these medications     amLODipine (NORVASC) 5 MG tablet          Discharge Condition:  Prognosis is poor          Allergies  Allergen Reactions  . Zolpidem Other (See Comments)    Causing falls, disorientation, sleep walking events  . Codeine Other (See Comments)    unknown  . Doxycycline Other (See Comments)    unknown  . Fosamax [Alendronate Sodium] Other (See Comments)    unknown  . Morphine And Related Other (See Comments)    unknown  . Novocain [Procaine  Hcl] Other (See Comments)    unknown  . Penicillins Other (See Comments)    unknown  . Percocet [Oxycodone-Acetaminophen] Other (See Comments)    unknown  . Skelaxin [Metaxalone] Other (See Comments)    unknown  . Sulfa Antibiotics Rash      Disposition: 01-Home or [facility]   Consults:    Significant Diagnostic Studies:  Dg Chest 2 View  Result Date: 10/19/2016 CLINICAL DATA:  Status post fall, with tachypnea and shallow respirations. Decreased O2 saturation. Initial encounter. EXAM: CHEST  2 VIEW COMPARISON:  Chest radiograph performed 12/23/2011 FINDINGS: The lungs are well-aerated. Mild vascular congestion is noted. Mild central airspace opacities could reflect infection or interstitial edema. A small left pleural effusion is noted. There is no evidence of  pneumothorax. The heart is borderline enlarged. No acute osseous abnormalities are seen. IMPRESSION: Mild vascular congestion and borderline cardiomegaly. Mild central airspace opacities could reflect infection or interstitial edema. Small left pleural effusion. Electronically Signed   By: Garald Balding M.D.   On: 10/19/2016 22:16   Ct Head Wo Contrast  Result Date: 10/19/2016 CLINICAL DATA:  Patient fell last night with confusion and slurring of speech this morning EXAM: CT HEAD WITHOUT CONTRAST TECHNIQUE: Contiguous axial images were obtained from the base of the skull through the vertex without intravenous contrast. COMPARISON:  Head CT from 02/04/2015 FINDINGS: BRAIN: There is sulcal and ventricular prominence consistent with superficial and central atrophy. No intraparenchymal hemorrhage, mass effect nor midline shift. Periventricular and subcortical white matter hypodensities consistent with chronic small vessel ischemic disease are identified. No acute large vascular territory infarcts. No abnormal extra-axial fluid collections. Basal cisterns are not effaced and midline. VASCULAR: Moderate calcific atherosclerosis of the carotid siphons. SKULL: No skull fracture. No significant scalp soft tissue swelling. Degenerate changes of the skullbase and upper cervical spine. SINUSES/ORBITS: The mastoid air-cells are clear. The included paranasal sinuses are well-aerated.The included ocular globes and orbital contents are non-suspicious. Bilateral lens replacements surgeries are noted. OTHER: None. IMPRESSION: Atrophy with chronic small vessel ischemic disease of periventricular white matter. No acute intracranial abnormality is noted. Electronically Signed   By: Ashley Royalty M.D.   On: 10/19/2016 22:36   US Abdomen Complete  Result Date: 10/20/2016 CLINICAL DATA:  Unspecified disorder of liver function with elevated liver function tests, acute renal injury. History of cholecystectomy, hysterectomy and colon  surgery. EXAM: ABDOMEN ULTRASOUND COMPLETE COMPARISON:  CT from 12/24/2011 FINDINGS: Gallbladder: Surgically absent Common bile duct: Diameter: 9 mm Liver: No space-occupying mass or biliary dilatation. Small amount of free fluid is noted surrounding liver. IVC: No abnormality visualized. Pancreas: Visualized portion unremarkable. Spleen: The spleen is mildly enlarged and measures 11.4 x 9.2 x 8.6 cm for a volume of 470 cubic cm. Right Kidney: Length: 9.1 cm. Echogenicity within increased. There is an echogenic noncalcified upper pole lesion consistent with a small angiomyolipoma. This measures approximately 1.3 x 0.9 x 1.2 cm. No nephrolithiasis nor obstructive uropathy. Left Kidney: Length: 9.2 cm with increased echogenicity. No mass or hydronephrosis visualized. Abdominal aorta: No aneurysm visualized.  Aortic atherosclerosis. Other findings: Left greater than right partially visualized small pleural effusions. IMPRESSION: 1. Status post cholecystectomy. 2. 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 Increased bilateral renal echogenicity consistent with medical renal disease. No obstructive uropathy. 3. Small amount of ascites adjacent to the liver. 4. Partially visualized pleural effusions bilaterally left greater than right. 5. Aortic atherosclerosis. Electronically Signed   By: Shanon Brow  Randel Pigg M.D.   On: 10/20/2016 03:32   Dg Chest Port 1 View  Result Date: 10/22/2016 CLINICAL DATA:  Chronic renal failure, lactic acidosis. Patient admitted 10/19/2016 due to a fall. EXAM: PORTABLE CHEST 1 VIEW COMPARISON:  PA and lateral chest 10/19/2016 and 12/23/2011. FINDINGS: Lung volumes are low. There small bilateral pleural effusions and basilar atelectasis. Heart size is upper normal. No evidence of pulmonary edema. No pneumothorax. IMPRESSION: No notable change in small bilateral pleural effusions and basilar atelectasis. Electronically Signed   By: Inge Rise M.D.   On: 10/22/2016  09:56   Dg Knee Complete 4 Views Left  Result Date: 10/19/2016 CLINICAL DATA:  Fall with knee pain EXAM: LEFT KNEE - COMPLETE 4+ VIEW COMPARISON:  None. FINDINGS: The bones are osteopenic. There is no acute fracture or dislocation at the left knee. There is mild chondrocalcinosis within the vertebral joint spaces. Prominent vascular calcifications are noted. No knee effusion. IMPRESSION: No acute fracture or dislocation of the left knee. Medial and lateral femorotibial compartment chondrocalcinosis. Electronically Signed   By: Ulyses Jarred M.D.   On: 10/19/2016 22:13    echocardiogram       Filed Weights   10/21/16 0457 10/22/16 0255 10/23/16 0300  Weight: 43.2 kg (95 lb 4.8 oz) 45.4 kg (100 lb 1.6 oz) 45.5 kg (100 lb 6.4 oz)     Microbiology: Recent Results (from the past 240 hour(s))  Blood Culture (routine x 2)     Status: None (Preliminary result)   Collection Time: 10/19/16  9:26 PM  Result Value Ref Range Status   Specimen Description BLOOD LEFT ANTECUBITAL  Final   Special Requests   Final    BOTTLES DRAWN AEROBIC AND ANAEROBIC Blood Culture adequate volume   Culture NO GROWTH 4 DAYS  Final   Report Status PENDING  Incomplete  Blood Culture (routine x 2)     Status: None (Preliminary result)   Collection Time: 10/19/16  9:31 PM  Result Value Ref Range Status   Specimen Description BLOOD RIGHT ANTECUBITAL  Final   Special Requests   Final    BOTTLES DRAWN AEROBIC AND ANAEROBIC Blood Culture adequate volume   Culture NO GROWTH 4 DAYS  Final   Report Status PENDING  Incomplete  MRSA PCR Screening     Status: None   Collection Time: 10/20/16 12:56 AM  Result Value Ref Range Status   MRSA by PCR NEGATIVE NEGATIVE Final    Comment:        The GeneXpert MRSA Assay (FDA approved for NASAL specimens only), is one component of a comprehensive MRSA colonization surveillance program. It is not intended to diagnose MRSA infection nor to guide or monitor treatment for MRSA  infections.        Blood Culture    Component Value Date/Time   SDES BLOOD RIGHT ANTECUBITAL 10/19/2016 2131   SPECREQUEST  10/19/2016 2131    BOTTLES DRAWN AEROBIC AND ANAEROBIC Blood Culture adequate volume   CULT NO GROWTH 4 DAYS 10/19/2016 2131   REPTSTATUS PENDING 10/19/2016 2131      Labs: Results for orders placed or performed during the hospital encounter of 10/19/16 (from the past 48 hour(s))  CBC     Status: Abnormal   Collection Time: 10/22/16  3:36 AM  Result Value Ref Range   WBC 12.9 (H) 4.0 - 10.5 K/uL   RBC 2.43 (L) 3.87 - 5.11 MIL/uL   Hemoglobin 8.2 (L) 12.0 - 15.0 g/dL   HCT 24.8 (L) 36.0 -  46.0 %   MCV 102.1 (H) 78.0 - 100.0 fL   MCH 33.7 26.0 - 34.0 pg   MCHC 33.1 30.0 - 36.0 g/dL   RDW 25.3 (H) 11.5 - 15.5 %   Platelets 49 (L) 150 - 400 K/uL    Comment: CONSISTENT WITH PREVIOUS RESULT  Comprehensive metabolic panel     Status: Abnormal   Collection Time: 10/22/16  3:36 AM  Result Value Ref Range   Sodium 148 (H) 135 - 145 mmol/L   Potassium 2.6 (LL) 3.5 - 5.1 mmol/L    Comment: DELTA CHECK NOTED CRITICAL RESULT CALLED TO, READ BACK BY AND VERIFIED WITH: KOME J,RN 10/22/16 0440 WAYK    Chloride 115 (H) 101 - 111 mmol/L   CO2 20 (L) 22 - 32 mmol/L   Glucose, Bld 146 (H) 65 - 99 mg/dL   BUN 51 (H) 6 - 20 mg/dL   Creatinine, Ser 1.29 (H) 0.44 - 1.00 mg/dL   Calcium 9.5 8.9 - 10.3 mg/dL   Total Protein 6.3 (L) 6.5 - 8.1 g/dL   Albumin 3.3 (L) 3.5 - 5.0 g/dL   AST 163 (H) 15 - 41 U/L   ALT 175 (H) 14 - 54 U/L   Alkaline Phosphatase 97 38 - 126 U/L   Total Bilirubin 2.7 (H) 0.3 - 1.2 mg/dL   GFR calc non Af Amer 34 (L) >60 mL/min   GFR calc Af Amer 39 (L) >60 mL/min    Comment: (NOTE) The eGFR has been calculated using the CKD EPI equation. This calculation has not been validated in all clinical situations. eGFR's persistently <60 mL/min signify possible Chronic Kidney Disease.    Anion gap 13 5 - 15  Magnesium     Status: None    Collection Time: 10/22/16  3:36 AM  Result Value Ref Range   Magnesium 2.3 1.7 - 2.4 mg/dL  Cancer antigen 19-9     Status: None   Collection Time: 10/22/16 10:18 AM  Result Value Ref Range   CA 19-9 1 0 - 35 U/mL    Comment: (NOTE) Roche ECLIA methodology Performed At: [facility] [address] [place], [place] [id number] Lindon Romp MD LS:[id number]   CEA     Status: None   Collection Time: 10/22/16 10:18 AM  Result Value Ref Range   CEA 2.4 0.0 - 4.7 ng/mL    Comment: (NOTE)       Roche ECLIA methodology       Nonsmokers  <3.9                                     Smokers     <5.6 Performed At: [facility] [place], [place] [id number] Lindon Romp MD [id number]      Lipid Panel  No results found for: CHOL, TRIG, HDL, CHOLHDL, VLDL, LDLCALC, LDLDIRECT   No results found for: HGBA1C   Lab Results  Component Value Date   CREATININE 1.29 (H) 10/22/2016     HPI :  83 y.o.femalewith medical history significant fordementia, hypertension, GERD, and transfusion-dependent anemia who presents from her nursing home with slurred speech, pallor, and a fall. She was admitted to the stepdown unit for ongoing evaluation and management of symptomatic anemia with acute kidney injury superimposed on chronic kidney disease stage IV, elevated troponin likely secondary to anemia and hypotension, lactic acidosis, possibly reflecting an infectious process versus  poor perfusion in the setting of anemia and hypotension.  HOSPITAL COURSE:    Probable pneumonia Initial chest x-ray 4/13, showed central airspace opacity, infection versus edema Patient has been on Vanco and cefepime since 4/13, abx now discontinued prior to dc  Blood cultures no growth so far Discontinue vancomycin Dysphagia 1 (puree)/nectar-thick liquids    Symptomatic anemia, thrombocytopenia -likely has myelodysplastic syndrome - Pt is followed by oncology for blood cell  dyscrasia - She was noted to have possible paraproteinemia, but her son/POA elected to avoid any further diagnostic intervention  - She has been managed with B-12 and iron supplementation, weekly dexamethasone, and has been requiring transfusions  - Last transfused early February 2018  Presented with Hgb of 5.4, status post 2 units 4/15, hemoglobin now 8.2 Platelet count worsening 62>49 Currently no active bleeding Suspect poor prognosis given age     Elevated troponin - No anginal complaints, and no apparent hx of CAD  - Initial trop 0.15 and EKG with ST-depressions inferiorly  - Enzyme trend has been flat, consistent with a demand ischemia in setting of symptomatic anemia with soft BP      Acute kidney injury superimposed on CKD stage IV  Creatinine at baseline of 1.7 , was 2.55, now 1.29  No obstructive process noted on renal US, which suggests medical renal disease  Status post volume resuscitation, with normal saline and blood Held ACE inhibitor Status post Kayexalate for hyperkalemia (k 5.8), patient is now hypokalemic, replete potassium    Slurred speech - Pt noted to have slurred speech on day of presentation by nursing home personel - Head CT is negative for acute intracranial abnormality  - Son at the bedside suspects this is secondary to dry mouth and not wearing her dentures  Son does not want any further work up   Hypernatremia Suspect poor oral intake Sodium has increased from 142>148 Started on half normal saline    Hypertension  - BP has been soft and her home medications (amlodipine, benazepril, and atenolol) are held initially  - Monitor and resume treatment as needed    GERD - No EGD report on file  - Managed with daily PPI at home, will continue   Probable Sepsis    Pt presents with WBC 17,400 and lactate of 8.33   Urine is not suggestive of infection, abd exam benign, and CXR with suspected pulm edema but may represent infection   Blood  cultures no growth so far, received fluids per sepsis protocol  30 cc/kg NS,   empiric vancomycin and cefepime was started,  now dc'd   Likely source of lung    Hyperbilirubinemia, elevated transaminases  Patient found to have elevated AST, ALT, bilirubin all improving  No significant abd tenderness and RUQ notable for s/p cholecystectomy    Likely acute liver injury in setting in soft BP and anemia,   CA19-9,CEA wnl Son has expressed desire to be conservative   , USG shows she is s/p cholecystectomy     Discharge Exam:   Blood pressure (!) 158/73, pulse 98, temperature 98.2 F (36.8 C), temperature source Oral, resp. rate 18, weight 45.5 kg (100 lb 6.4 oz), SpO2 100 %.   HENT:  Head: Atraumatic.  Temporal wasting  Cardiovascular:  PVCs  Pulmonary/Chest: Effort normal.  Musculoskeletal: Normal range of motion.  Neurological:    Minimal speech and what a few words she tries to communicate are hard to understand. Is keeping her eyes closed  Skin:  Cool, pale  Psychiatric:  Appears anxious and frightened when I attempt to speak to her. She begins to shake him pull the covers over herself  Nursing note and vitals reviewed.     SignedReyne Dumas 10/23/2016, 12:20 PM        Time spent >45 mins

## 2016-10-23 NOTE — Progress Notes (Signed)
Daily Progress Note   Patient Name: Carolyn Odonnell       Date: 10/23/2016 DOB: February 27, 1920  Age: 83 y.o. MRN#: [id number] Attending Physician: Reyne Dumas, MD Primary Care Physician: Kandice Hams, MD Admit Date: 10/19/2016  Reason for Consultation/Follow-up: Establishing goals of care and Terminal Care  Subjective: Bed at [facility] ready today. Patient son at bedside- appreciative of care and ready for transfer to [facility] and comfort care. Patient denies pain. Eating small bites of ice cream. Drifts to sleep during conversation. Son notes past year has been difficult with loss of his wife in September also at [facility].   ROS  Length of Stay: 4  Current Medications: Scheduled Meds:  . cholecalciferol  2,000 Units Oral q morning - 10a  . feeding supplement (ENSURE ENLIVE)  237 mL Oral TID BM  . mirtazapine  15 mg Oral QHS  . PARoxetine  40 mg Oral Daily  . potassium chloride  40 mEq Oral TID  . senna-docusate  2 tablet Oral Daily  . vitamin B-12  1,000 mcg Oral Daily    Continuous Infusions:   PRN Meds: ALPRAZolam, HYDROmorphone (DILAUDID) injection, nitroGLYCERIN, ondansetron, RESOURCE THICKENUP CLEAR  Physical Exam  Constitutional:  frail  HENT:  Head: Normocephalic and atraumatic.  Cardiovascular: Normal rate and regular rhythm.   Pulmonary/Chest: Effort normal and breath sounds normal.  Neurological:  drowsy  Skin: Skin is warm and dry.  Bruising on hand and arms  Psychiatric: She has a normal mood and affect.  pleasant            Vital Signs: BP (!) 167/81 (BP Location: Left Arm)   Pulse 96   Temp 98.4 F (36.9 C) (Axillary)   Resp (!) 27   Wt 45.5 kg (100 lb 6.4 oz)   SpO2 94%   BMI 18.13 kg/m  SpO2: SpO2: 94 % O2 Device: O2 Device: Not  Delivered O2 Flow Rate: O2 Flow Rate (L/min): 2.5 L/min  Intake/output summary:  Intake/Output Summary (Last 24 hours) at 10/23/16 0917 Last data filed at 10/23/16 0906  Gross per 24 hour  Intake             1000 ml  Output                0 ml  Net  1000 ml   LBM: Last BM Date: 10/21/16 Baseline Weight: Weight: 43.9 kg (96 lb 12.5 oz) Most recent weight: Weight: 45.5 kg (100 lb 6.4 oz)       Palliative Assessment/Data: PPS: 20%    Flowsheet Rows     Most Recent Value  Intake Tab  Referral Department  Hospitalist  Unit at Time of Referral  Med/Surg Unit  Palliative Care Primary Diagnosis  Sepsis/Infectious Disease  Date Notified  10/20/16  Palliative Care Type  New Palliative care  Reason for referral  Clarify Goals of Care  Date of Admission  10/19/16  Date first seen by Palliative Care  10/20/16  # of days Palliative referral response time  0 Day(s)  # of days IP prior to Palliative referral  1  Clinical Assessment  Palliative Performance Scale Score  30%  Pain Max last 24 hours  Not able to report  Pain Min Last 24 hours  Not able to report  Dyspnea Max Last 24 Hours  Not able to report  Dyspnea Min Last 24 hours  Not able to report  Nausea Max Last 24 Hours  Not able to report  Nausea Min Last 24 Hours  Not able to report  Anxiety Max Last 24 Hours  Not able to report  Anxiety Min Last 24 Hours  Not able to report  Other Max Last 24 Hours  Not able to report  Psychosocial & Spiritual Assessment  Palliative Care Outcomes  Patient/Family meeting held?  Yes  Who was at the meeting?  son  Palliative Care Outcomes  Clarified goals of care      Patient Active Problem List   Diagnosis Date Noted  . Hyperbilirubinemia 10/20/2016  . Abnormal transaminases 10/20/2016  . Elevated liver function tests   . Elevated troponin   . Palliative care by specialist   . CKD (chronic kidney disease), stage IV ([place]) 10/19/2016  . AKI (acute kidney injury) ([place])  10/19/2016  . Slurred speech 10/19/2016  . Hyperkalemia 10/19/2016  . NSTEMI (non-ST elevated myocardial infarction) ([place]) 10/19/2016  . Symptomatic anemia 10/19/2016  . Anemia 10/19/2016  . Thrombocytopenia ([place]) 10/19/2016  . Lactic acidosis 10/19/2016  . Syncope 12/23/2011  . Leucocytosis 12/23/2011  . Thoracic spine fracture ([place]) 12/23/2011  . HTN (hypertension) 12/23/2011    Palliative Care Assessment & Plan   Patient Profile: 83 y.o.femalewith past medical history of Hypertension, chronic kidney disease stage IV, non-STEMI, dementia, transfusion dependent anemiaadmitted on 4/13/2018with slurred speech, pallor, status post fall. Her hemoglobin on admission was 5.4. Per chart review her last transfusion was February 2018 and at that time her hemoglobin was 8.6. She also has an elevated lactic acid at 8.33, leukocytosis 17,400. She was started on antibiotics empirically. Blood cultures thus far negative. She has MDS and chronic anemia. Has declined significantly over the last months. Decision made per son to transition to comfort care, stop antibiotics, d/c to [facility].  Assessment/Recommendations/Plan   D/C today to [facility] of facility DNR on chart   Goals of Care and Additional Recommendations:  Limitations on Scope of Treatment: Full Comfort Care  Code Status:  DNR  Prognosis:   < 2 weeks d/t transition to comfort care only in setting of poor po intake, sepsis, hypotension  Discharge Planning:  Hospice facility  Care plan was discussed with patient's brother.  Thank you for allowing the Palliative Medicine Team to assist in the care of this patient.   Time In: 0900 Time Out:  0926 Total Time 26 minutes Prolonged Time Billed No      Greater than 50%  of this time was spent counseling and coordinating care related to the above assessment and plan.  Mariana Kaufman, AGNP-C Palliative Medicine   Please contact Palliative Medicine Team phone at  [phone] for questions and concerns.

## 2016-10-23 NOTE — Progress Notes (Signed)
Clinical Social Worker facilitated patient discharge including contacting patient family and facility to confirm patient discharge plans.  Clinical information faxed to facility and family agreeable with plan.  CSW arranged ambulance transport via [facility] to [facility] .  RN Steffanie Dunn to call [phone] report prior to discharge.  Clinical Social Worker will sign off for now as social work intervention is no longer needed. Please consult [place] again if new need arises.  Rhea Pink, MSW, [place]

## 2016-10-24 LAB — CULTURE, BLOOD (ROUTINE X 2)
CULTURE: NO GROWTH
Culture: NO GROWTH
Special Requests: ADEQUATE
Special Requests: ADEQUATE

## 2016-12-07 DEATH — deceased

## 2017-05-06 IMAGING — US US ABDOMEN COMPLETE
1 series · 13 of 25 positions shown · non-contrast
Comparison: CT from 12/24/2011

CLINICAL DATA: Unspecified disorder of liver function with elevated
liver function tests, acute renal injury. History of
cholecystectomy, hysterectomy and colon surgery.

EXAM:
ABDOMEN ULTRASOUND COMPLETE

[Series 1: us abdomen complete · 0.20mm/px · 13 of 72 slices shown]
[im 1/72]
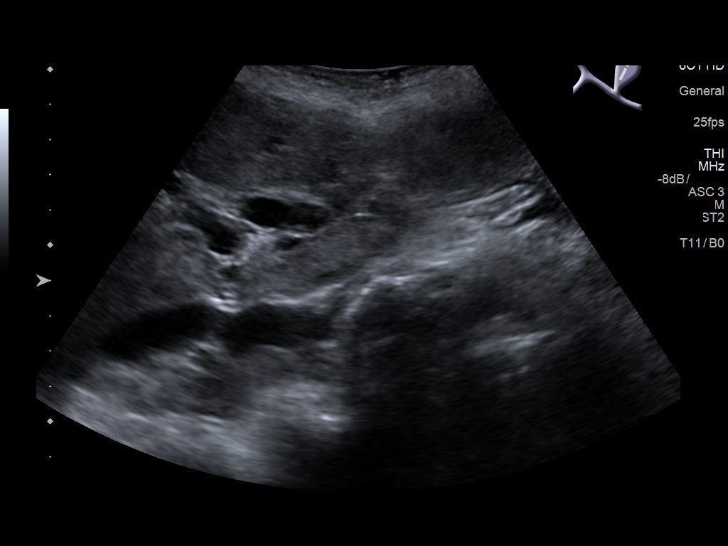
[im 6/72]
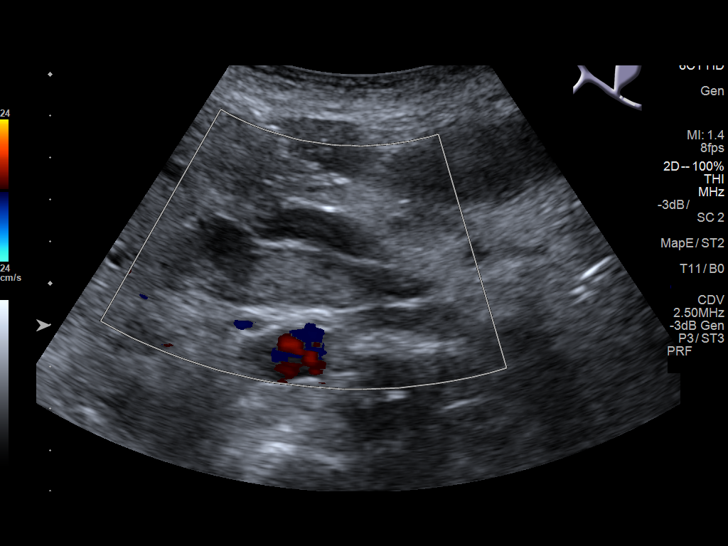
[im 12/72]
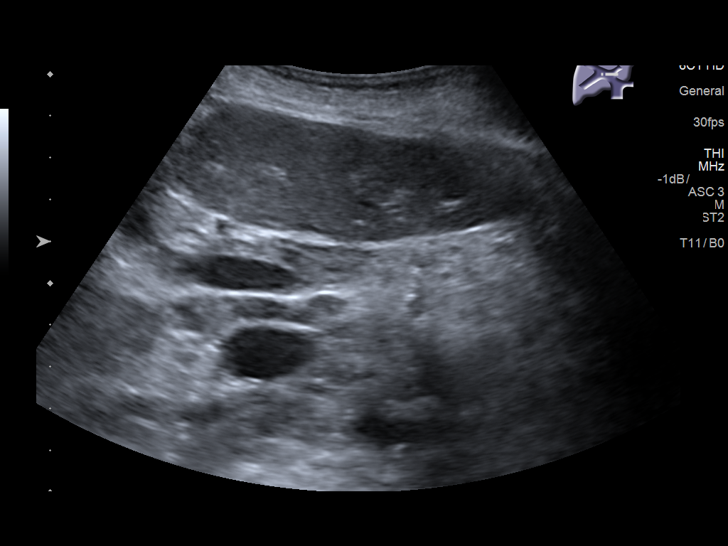
[im 18/72]
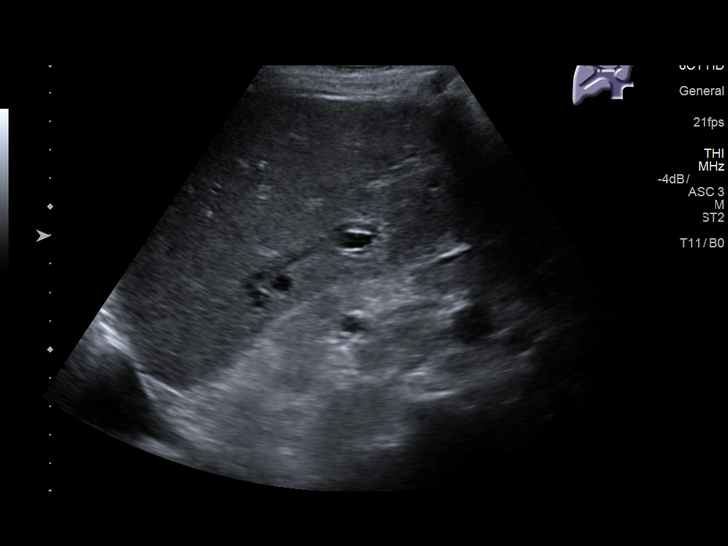
[im 24/72]
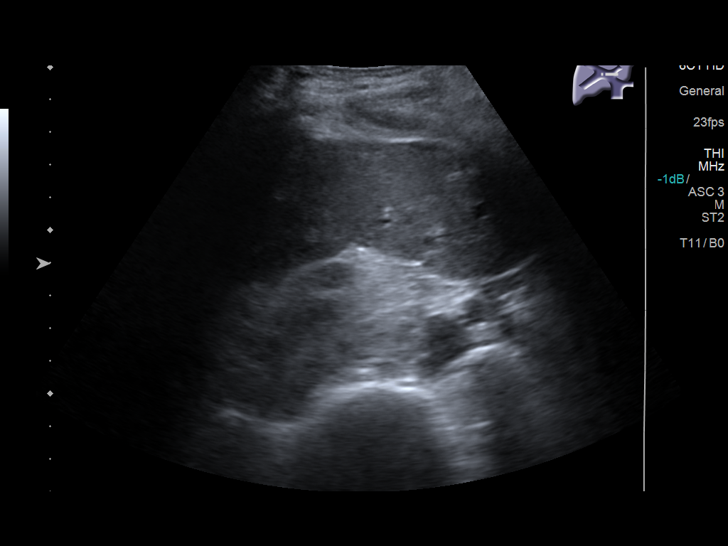
[im 30/72]
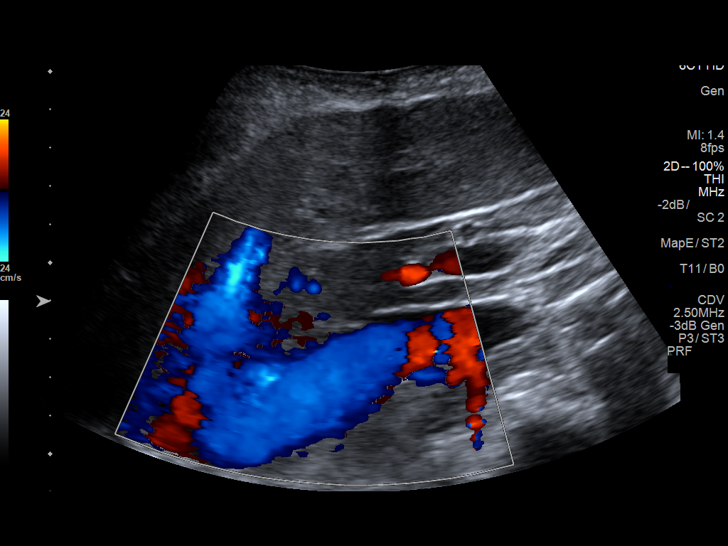
[im 36/72]
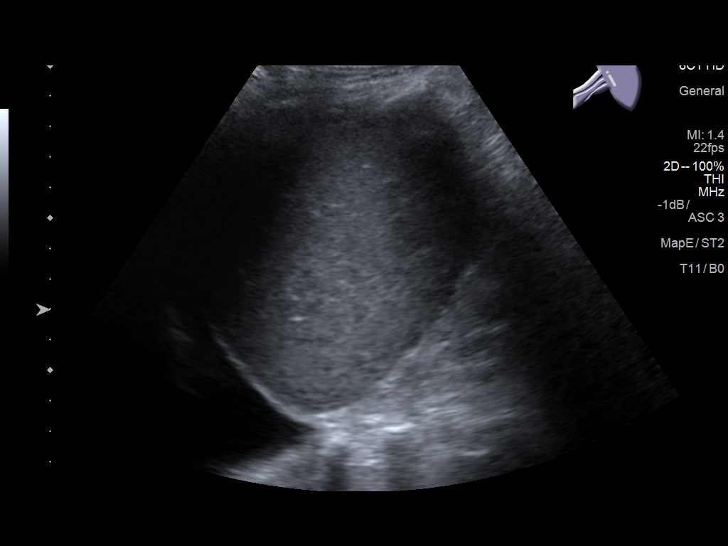
[im 42/72]
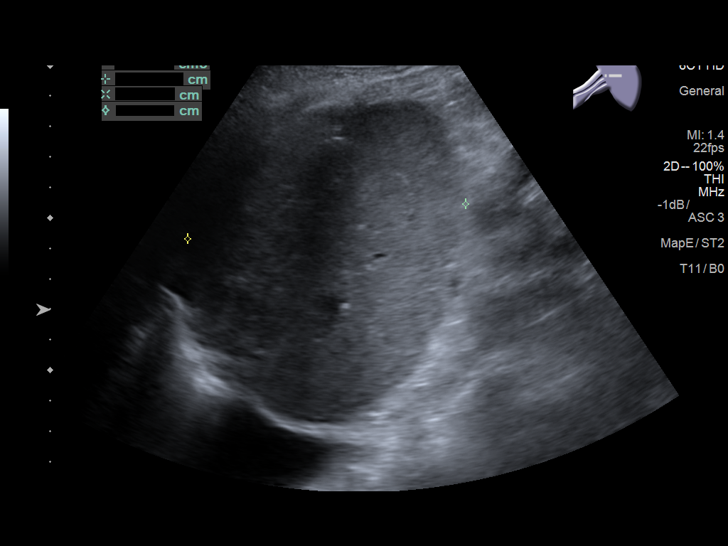
[im 48/72]
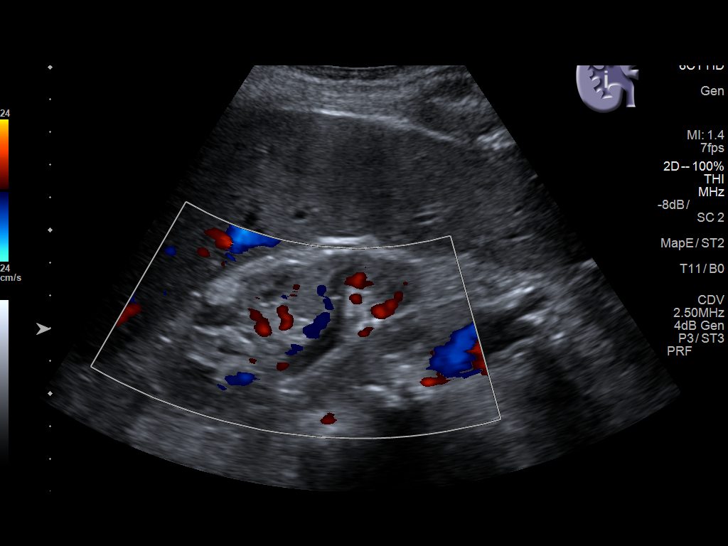
[im 54/72]
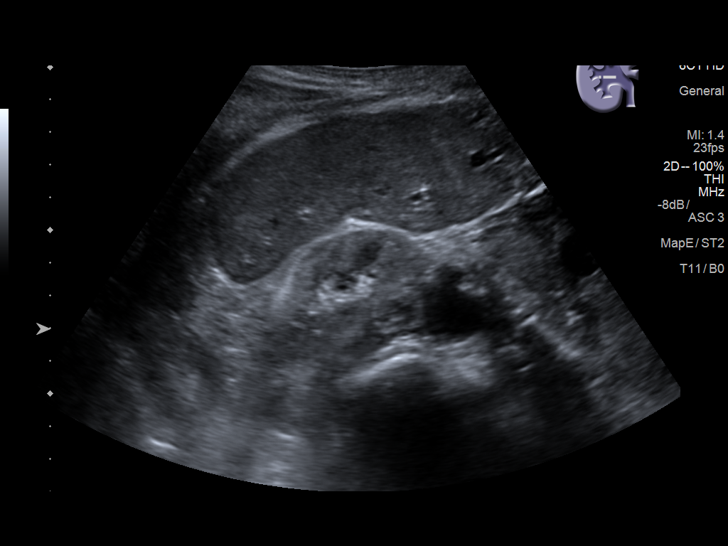
[im 60/72]
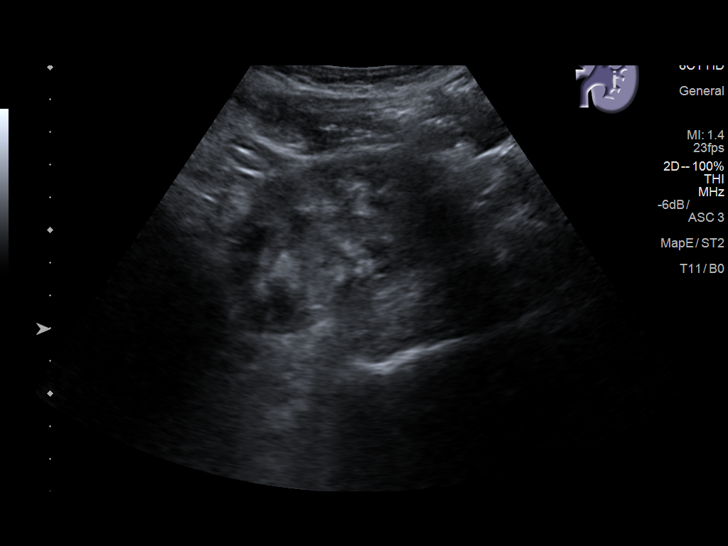
[im 66/72]
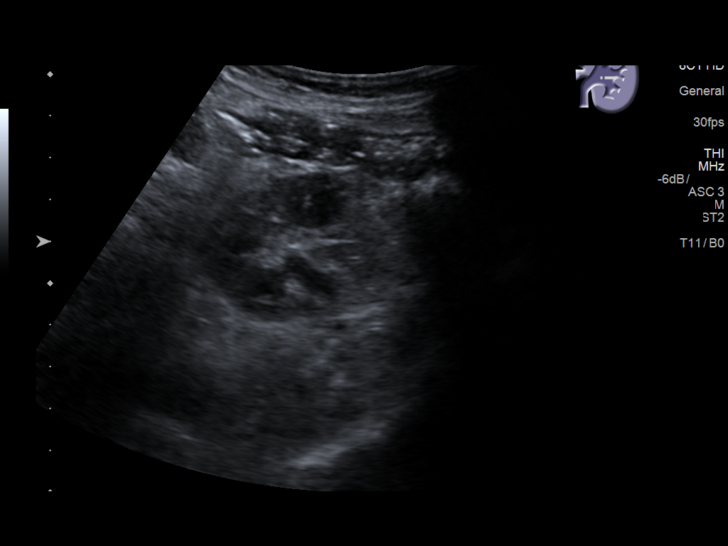
[im 72/72]
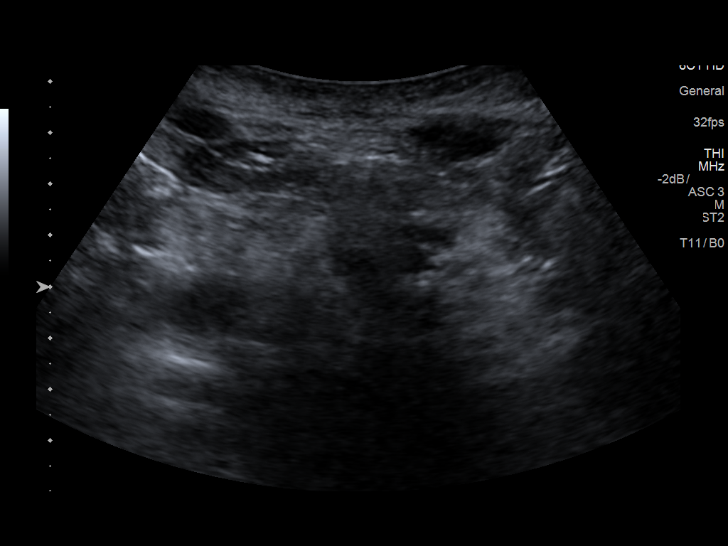

[13 of 25 positions shown; findings below may reference images not displayed]

FINDINGS: Gallbladder: Surgically absent

Common bile duct: Diameter: 9 mm

Liver: No space-occupying mass or biliary dilatation. Small amount
of free fluid is noted surrounding liver.

IVC: No abnormality visualized.

Pancreas: Visualized portion unremarkable.

Spleen: The spleen is mildly enlarged and measures 11.4 x 9.2 x
cm for a volume of 470 cubic cm.

Right Kidney: Length: 9.1 cm. Echogenicity within increased. There
is an echogenic noncalcified upper pole lesion consistent with a
small angiomyolipoma. This measures approximately 1.3 x 0.9 x
cm. No nephrolithiasis nor obstructive uropathy.

Left Kidney: Length: 9.2 cm with increased echogenicity. No mass or
hydronephrosis visualized.

Abdominal aorta: No aneurysm visualized.  Aortic atherosclerosis.

Other findings: Left greater than right partially visualized small
pleural effusions.
IMPRESSION: 1. Status post cholecystectomy.
2. 1.3 x 0.9 x 1.2 cm angiomyolipoma of the upper pole the right
kidney. Increased bilateral renal echogenicity consistent with
medical renal disease. No obstructive uropathy.
3. Small amount of ascites adjacent to the liver.
4. Partially visualized pleural effusions bilaterally left greater
than right.
5. Aortic atherosclerosis.

## 2017-07-01 IMAGING — CT CT HEAD W/O CM
3 series · 15 of 47 positions shown, 18 images · non-contrast
Comparison: Head CT from 02/04/2015

CLINICAL DATA: Patient fell last night with confusion and slurring
of speech this morning

EXAM:
CT HEAD WITHOUT CONTRAST
TECHNIQUE: Contiguous axial images were obtained from the base of the skull
through the vertex without intravenous contrast.

[Series 3: head 5.0 h30s · axial · 0.45mm/px · z∈[-127,+13]mm · 9 of 34 slices shown, 12 images]
[im 3/34  brain]
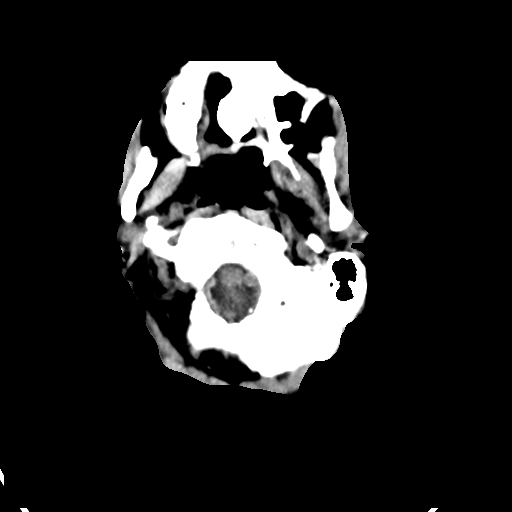
[im 3/34  bone]
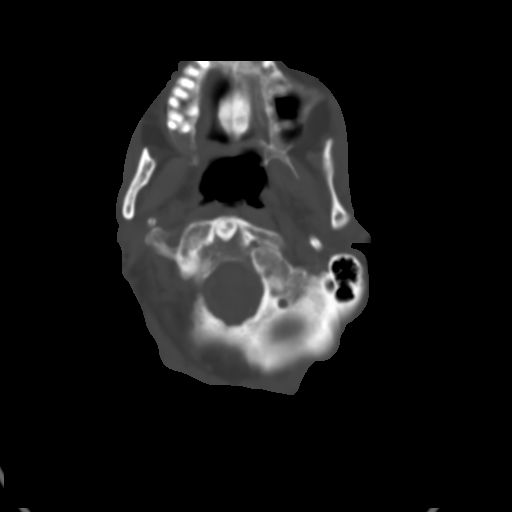
[im 6/34  brain]
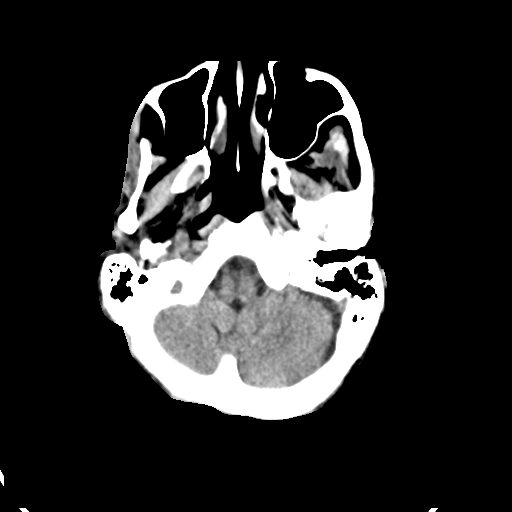
[im 10/34  brain]
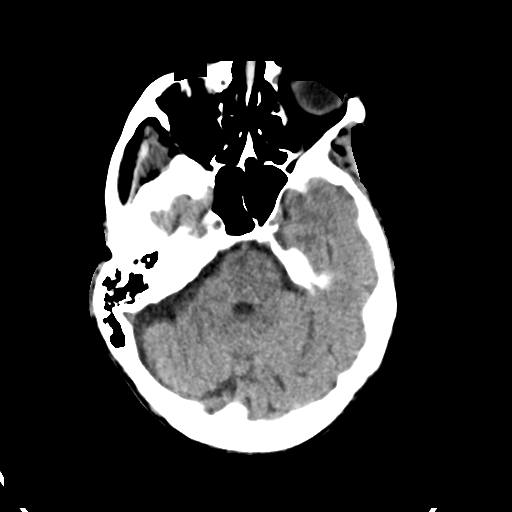
[im 13/34  brain]
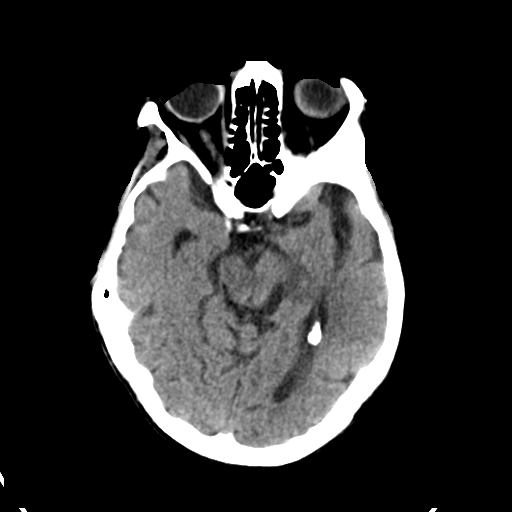
[im 18/34  brain]
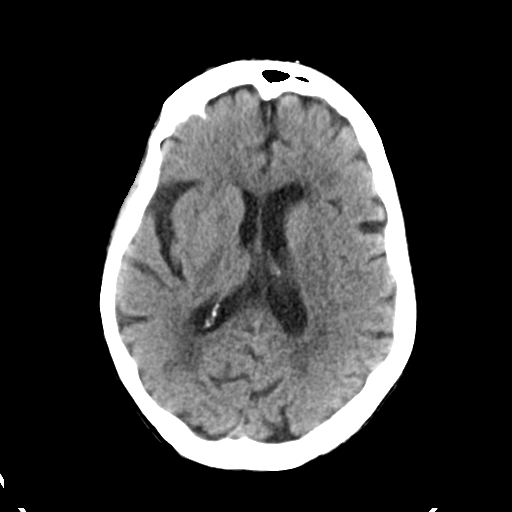
[im 18/34  bone]
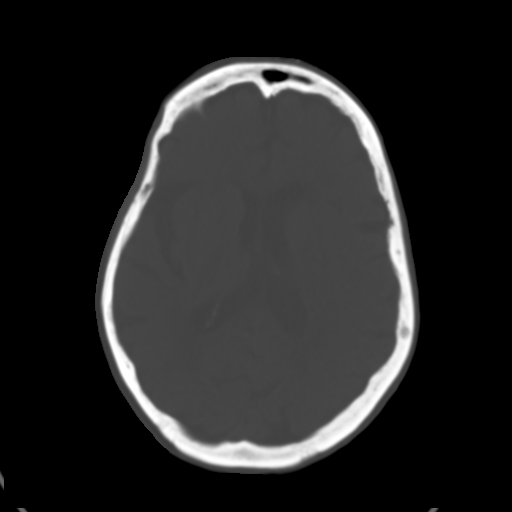
[im 21/34  brain]
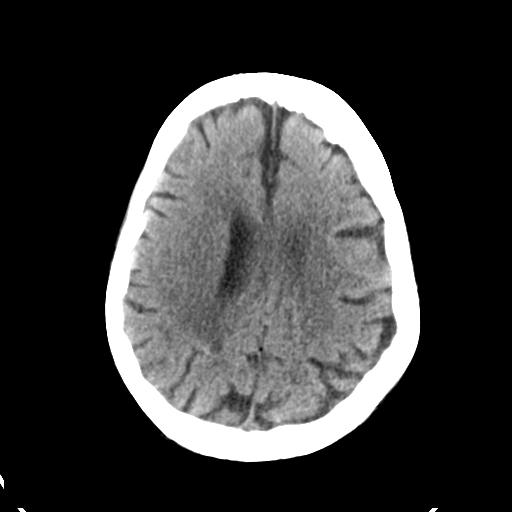
[im 24/34  brain]
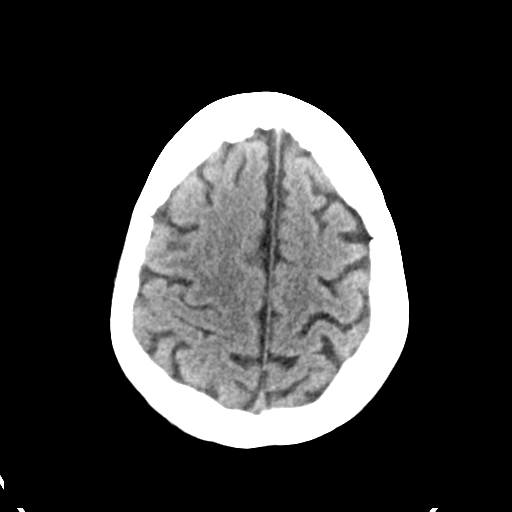
[im 28/34  brain]
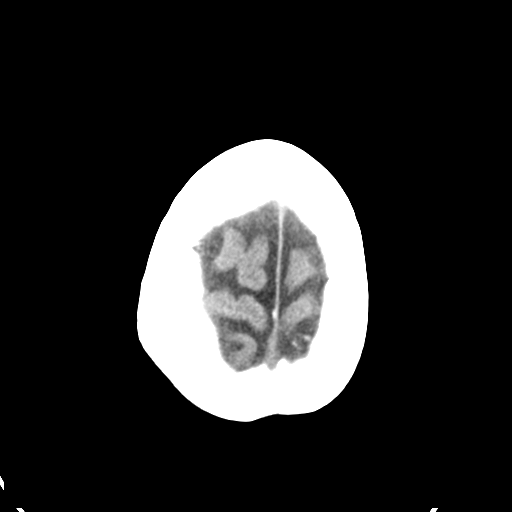
[im 31/34  brain]
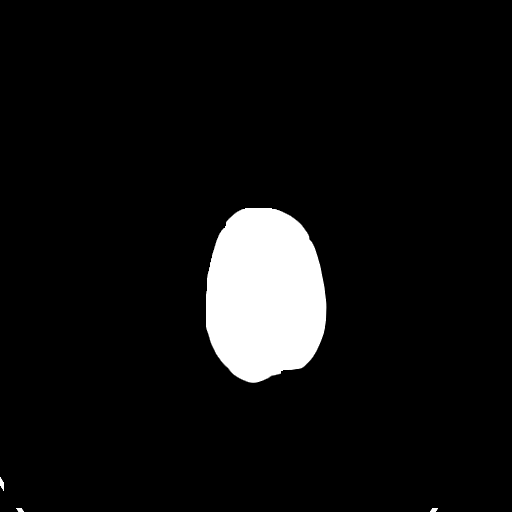
[im 31/34  bone]
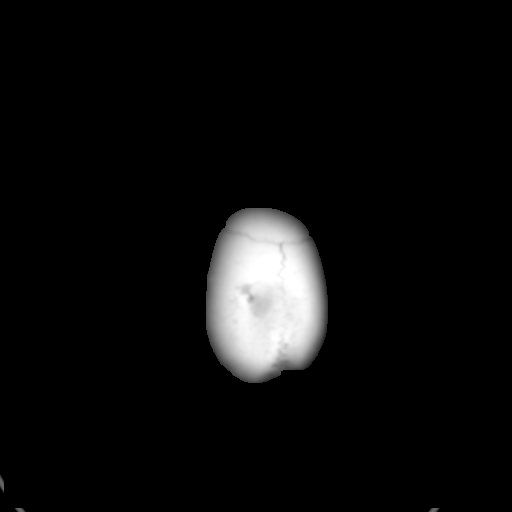

[Series 5: head 3.0 mpr cor · coronal · 0.33mm/px · 3 of 68 slices shown]
[im 23/68  brain]
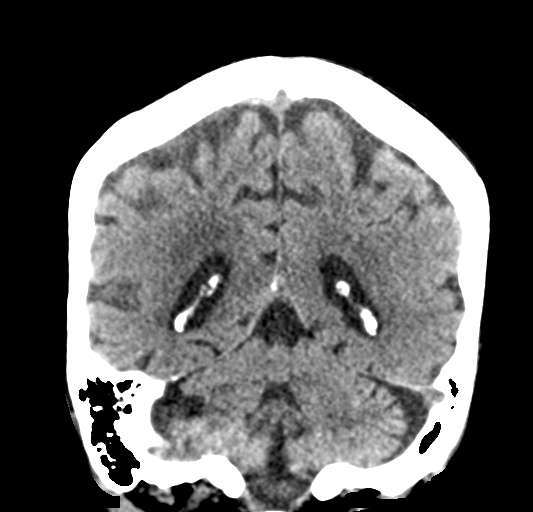
[im 30/68  brain]
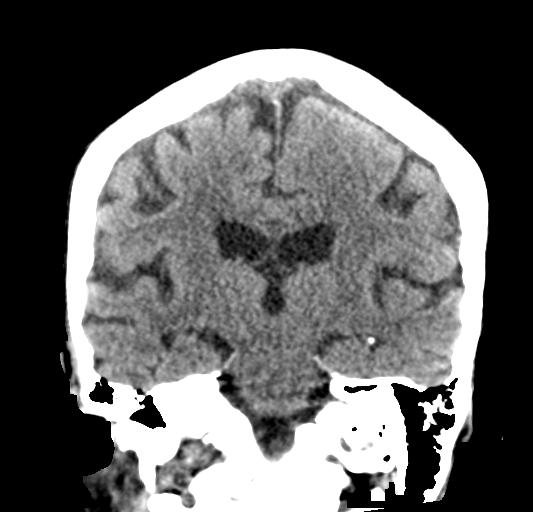
[im 38/68  brain]
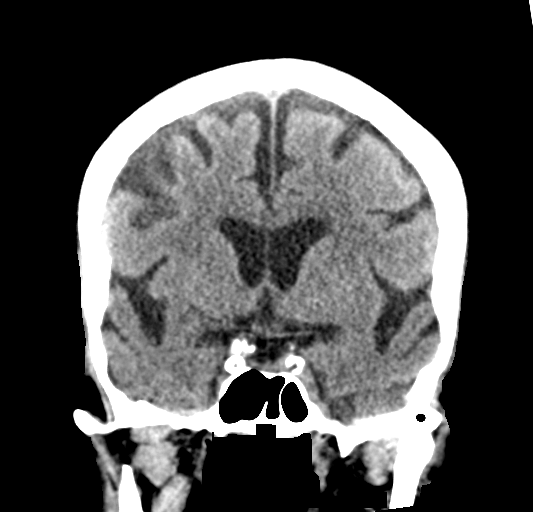

[Series 6: head 3.0 mpr sag · sagittal · 0.33mm/px · 3 of 52 slices shown]
[im 19/52  brain]
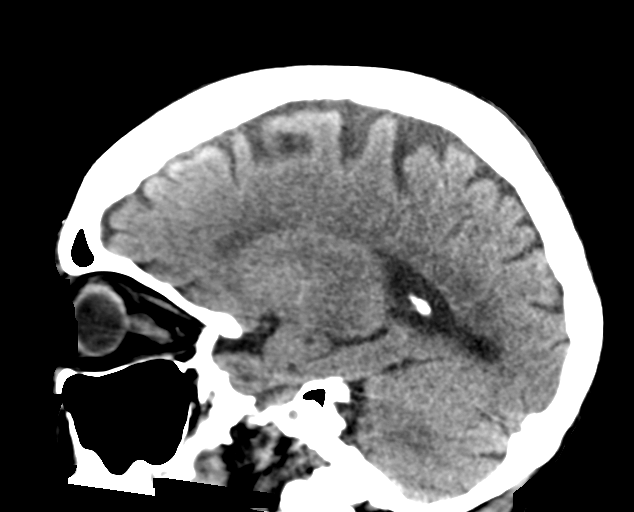
[im 26/52  brain]
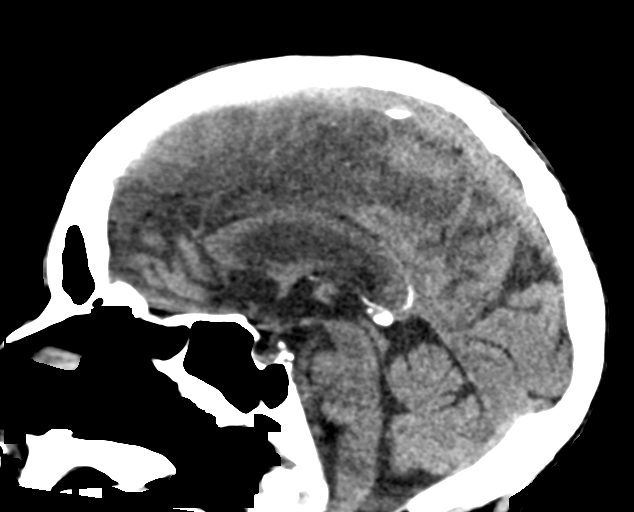
[im 33/52  brain]
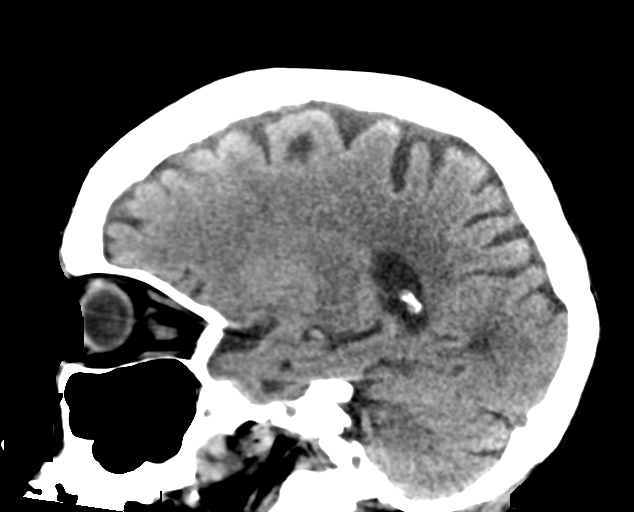

[15 of 47 positions shown; findings below may reference images not displayed]

FINDINGS: BRAIN: There is sulcal and ventricular prominence consistent with
superficial and central atrophy. No intraparenchymal hemorrhage,
mass effect nor midline shift. Periventricular and subcortical white
matter hypodensities consistent with chronic small vessel ischemic
disease are identified. No acute large vascular territory infarcts.
No abnormal extra-axial fluid collections. Basal cisterns are not
effaced and midline.

VASCULAR: Moderate calcific atherosclerosis of the carotid siphons.

SKULL: No skull fracture. No significant scalp soft tissue swelling.
Degenerate changes of the skullbase and upper cervical spine.

SINUSES/ORBITS: The mastoid air-cells are clear. The included
paranasal sinuses are well-aerated.The included ocular globes and
orbital contents are non-suspicious. Bilateral lens replacements
surgeries are noted.

OTHER: None.
IMPRESSION: Atrophy with chronic small vessel ischemic disease of
periventricular white matter. No acute intracranial abnormality is
noted.

## 2017-07-04 IMAGING — DX DG CHEST 1V PORT
1 series · 1 of 1 positions shown · non-contrast
Comparison: PA and lateral chest 10/19/2016 and 12/23/2011.

CLINICAL DATA: Chronic renal failure, lactic acidosis. Patient
admitted 10/19/2016 due to a fall.

EXAM:
PORTABLE CHEST 1 VIEW

[chest ap]
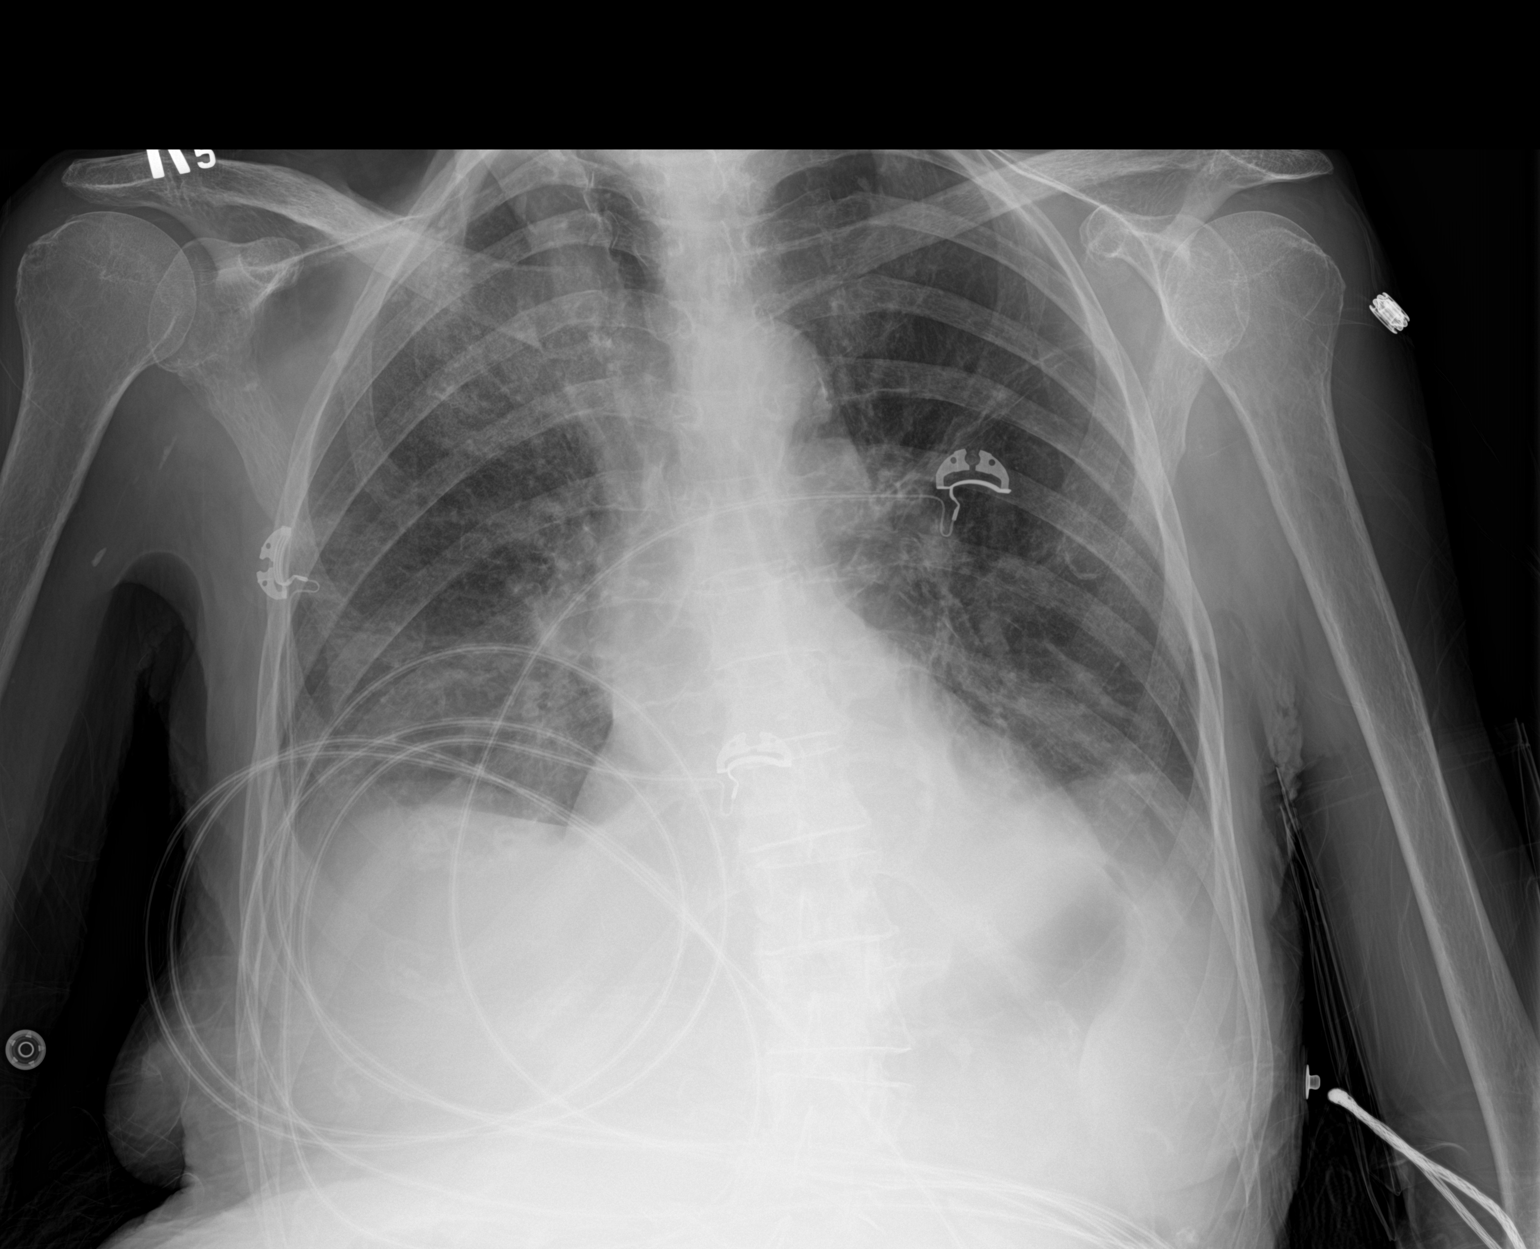

[1 of 1 positions shown; findings below may reference images not displayed]

FINDINGS: Lung volumes are low. There small bilateral pleural effusions and
basilar atelectasis. Heart size is upper normal. No evidence of
pulmonary edema. No pneumothorax.
IMPRESSION: No notable change in small bilateral pleural effusions and basilar
atelectasis.
